# Patient Record
Sex: Male | Born: 1950
Health system: Southern US, Community
[De-identification: ages and names within clinical notes are randomized; demographics above are authoritative.]

## PROBLEM LIST (undated history)

## (undated) DIAGNOSIS — N529 Male erectile dysfunction, unspecified: Secondary | ICD-10-CM

## (undated) DIAGNOSIS — I739 Peripheral vascular disease, unspecified: Secondary | ICD-10-CM

## (undated) DIAGNOSIS — I10 Essential (primary) hypertension: Secondary | ICD-10-CM

## (undated) DIAGNOSIS — E785 Hyperlipidemia, unspecified: Secondary | ICD-10-CM

## (undated) DIAGNOSIS — E119 Type 2 diabetes mellitus without complications: Secondary | ICD-10-CM

## (undated) HISTORY — DX: Male erectile dysfunction, unspecified: N52.9

## (undated) HISTORY — DX: Peripheral vascular disease, unspecified: I73.9

## (undated) HISTORY — PX: KNEE SURGERY: SHX244

## (undated) HISTORY — DX: Essential (primary) hypertension: I10

## (undated) HISTORY — DX: Hyperlipidemia, unspecified: E78.5

## (undated) HISTORY — DX: Type 2 diabetes mellitus without complications: E11.9

---

## 2005-10-15 ENCOUNTER — Ambulatory Visit (HOSPITAL_COMMUNITY): Admission: RE | Admit: 2005-10-15 | Discharge: 2005-10-15 | Payer: Self-pay | Admitting: *Deleted

## 2011-08-11 ENCOUNTER — Ambulatory Visit (INDEPENDENT_AMBULATORY_CARE_PROVIDER_SITE_OTHER): Payer: BC Managed Care – PPO

## 2011-08-11 DIAGNOSIS — I10 Essential (primary) hypertension: Secondary | ICD-10-CM

## 2011-08-11 DIAGNOSIS — E789 Disorder of lipoprotein metabolism, unspecified: Secondary | ICD-10-CM

## 2011-08-11 DIAGNOSIS — E236 Other disorders of pituitary gland: Secondary | ICD-10-CM

## 2012-02-19 ENCOUNTER — Ambulatory Visit (INDEPENDENT_AMBULATORY_CARE_PROVIDER_SITE_OTHER): Payer: BC Managed Care – PPO | Admitting: Family Medicine

## 2012-02-19 VITALS — BP 124/83 | HR 80 | Temp 98.1°F | Resp 18 | Ht 71.0 in | Wt 186.0 lb

## 2012-02-19 DIAGNOSIS — N529 Male erectile dysfunction, unspecified: Secondary | ICD-10-CM

## 2012-02-19 DIAGNOSIS — E785 Hyperlipidemia, unspecified: Secondary | ICD-10-CM

## 2012-02-19 DIAGNOSIS — M7710 Lateral epicondylitis, unspecified elbow: Secondary | ICD-10-CM

## 2012-02-19 DIAGNOSIS — I10 Essential (primary) hypertension: Secondary | ICD-10-CM

## 2012-02-19 DIAGNOSIS — M79609 Pain in unspecified limb: Secondary | ICD-10-CM

## 2012-02-19 LAB — BASIC METABOLIC PANEL
CO2: 28 mEq/L (ref 19–32)
Calcium: 9.9 mg/dL (ref 8.4–10.5)
Chloride: 102 mEq/L (ref 96–112)
Glucose, Bld: 122 mg/dL — ABNORMAL HIGH (ref 70–99)
Potassium: 3.6 mEq/L (ref 3.5–5.3)
Sodium: 139 mEq/L (ref 135–145)

## 2012-02-19 LAB — POCT URINALYSIS DIPSTICK
Bilirubin, UA: NEGATIVE
Blood, UA: NEGATIVE
Glucose, UA: NEGATIVE
Ketones, UA: NEGATIVE
Leukocytes, UA: NEGATIVE
Nitrite, UA: NEGATIVE
Protein, UA: NEGATIVE
Spec Grav, UA: 1.02
Urobilinogen, UA: 0.2
pH, UA: 5.5

## 2012-02-19 LAB — BASIC METABOLIC PANEL WITH GFR
BUN: 18 mg/dL (ref 6–23)
Creat: 1.42 mg/dL — ABNORMAL HIGH (ref 0.50–1.35)

## 2012-02-19 MED ORDER — ROSUVASTATIN CALCIUM 10 MG PO TABS: 10.0000 mg | ORAL_TABLET | Freq: Every day | ORAL | Status: DC

## 2012-02-19 MED ORDER — ATENOLOL 25 MG PO TABS: 25.0000 mg | ORAL_TABLET | Freq: Every day | ORAL | Status: DC

## 2012-02-19 MED ORDER — SILDENAFIL CITRATE 100 MG PO TABS: 100.0000 mg | ORAL_TABLET | Freq: Every day | ORAL | Status: DC | PRN

## 2012-02-19 MED ORDER — LISINOPRIL-HYDROCHLOROTHIAZIDE 20-12.5 MG PO TABS: 1.0000 | ORAL_TABLET | Freq: Every day | ORAL | Status: DC

## 2012-02-19 MED ORDER — NIFEDIPINE ER 60 MG PO TB24: 60.0000 mg | ORAL_TABLET | Freq: Every day | ORAL | Status: DC

## 2012-02-19 NOTE — Progress Notes (Signed)
[facility]:  Office Visit  Chief Complaint:  Chief Complaint  Patient presents with  . Arm Pain    right    HPI: Jerry Rangel is a 61 y.o. male who complains of : 1. Medication Refill-HTN, XOL well controlled, compliant, No SEs 2. Right arm pain at elbow-throbbing pain intermittent, radiates to shoulder, lasts few minutes, has been for 1 year or more, has not tried anything for it. Denies numbness/tingling.   Past Medical History  Diagnosis Date  . Hyperlipidemia   . Hypertension   . Erectile dysfunction    History reviewed. No pertinent past surgical history. History   Social History  . Marital Status: Married    Spouse Name: N/A    Number of Children: N/A  . Years of Education: N/A   Social History Main Topics  . Smoking status: Never Smoker   . Smokeless tobacco: None  . Alcohol Use: Yes  . Drug Use: No  . Sexually Active: None   Other Topics Concern  . None   Social History Narrative  . None   No family history on file. No Known Allergies Prior to Admission medications   Medication Sig Start Date End Date Taking? Authorizing Provider  aspirin 81 MG tablet Take 81 mg by mouth daily.   Yes Historical Provider, MD  atenolol (TENORMIN) 25 MG tablet Take 25 mg by mouth daily.   Yes Historical Provider, MD  lisinopril-hydrochlorothiazide (PRINZIDE,ZESTORETIC) 20-12.5 MG per tablet Take 1 tablet by mouth daily.   Yes Historical Provider, MD  NIFEdipine (PROCARDIA-XL/ADALAT CC) 60 MG 24 hr tablet Take 60 mg by mouth daily.   Yes Historical Provider, MD  rosuvastatin (CRESTOR) 10 MG tablet Take 10 mg by mouth daily.   Yes Historical Provider, MD  sildenafil (VIAGRA) 100 MG tablet Take 100 mg by mouth daily as needed.   Yes Historical Provider, MD     ROS: The patient denies fevers, chills, night sweats, unintentional weight loss, chest pain, palpitations, wheezing, dyspnea on exertion, nausea, vomiting, abdominal pain, dysuria, hematuria,  melena, numbness, weakness, or tingling.   All other systems have been reviewed and were otherwise negative with the exception of those mentioned in the HPI and as above.    PHYSICAL EXAM: Filed Vitals:   02/19/12 1448  BP: 124/83  Pulse: 80  Temp: 98.1 F (36.7 C)  Resp: 18   Filed Vitals:   02/19/12 1448  Height: 5\' 11"  (1.803 m)  Weight: 186 lb (84.369 kg)   Body mass index is 25.94 kg/(m^2).  General: Alert, no acute distress HEENT:  Normocephalic, atraumatic, oropharynx patent.  Cardiovascular:  Regular rate and rhythm, no rubs murmurs or gallops.  No Carotid bruits, radial pulse intact. No pedal edema.  Respiratory: Clear to auscultation bilaterally.  No wheezes, rales, or rhonchi.  No cyanosis, no use of accessory musculature GI: No organomegaly, abdomen is soft and non-tender, positive bowel sounds.  No masses. Skin: No rashes. Neurologic: Facial musculature symmetric. Psychiatric: Patient is appropriate throughout our interaction. Lymphatic: No cervical lymphadenopathy Musculoskeletal: Gait intact. Neck-nl Right shoulder-nl Roght elbow-+ tenderness at lateral epicondyle, AROM/PROM intact, +radial pulse, 5/5 strength, 2/2 DTR, neg. Tinels, neg Biceps tendon    LABS: Results for orders placed in visit on 02/19/12  POCT URINALYSIS DIPSTICK      Component Value Range   Color, UA yellow     Clarity, UA clear     Glucose, UA neg     Bilirubin, UA neg  Ketones, UA neg     Spec Grav, UA 1.020     Blood, UA neg     pH, UA 5.5     Protein, UA neg     Urobilinogen, UA 0.2     Nitrite, UA neg     Leukocytes, UA Negative       EKG/XRAY:   Primary read interpreted by Dr. Conley Rolls at [facility].   ASSESSMENT/PLAN: Encounter Diagnoses  Name Primary?  . Pain in limb Yes  . HTN (hypertension)   . Lateral epicondylitis   . Erectile dysfunction   . Hyperlipidemia    Refill meds for HTN, XOL, ED BMP, UA for HTN Return in 6 months for fasting lipids, CMP, TSH Advise  OTC Tylenol/motrin prn for elbow pain, consider steroid injection if worsening pain     Satoya Feeley PHUONG, DO 02/19/2012 3:23 PM

## 2012-02-19 NOTE — Patient Instructions (Signed)
Lateral Epicondylitis (Tennis Elbow) with Rehab Lateral epicondylitis involves inflammation and pain around the outer portion of the elbow. The pain is caused by inflammation of the tendons in the forearm that bring back (extend) the wrist. Lateral epicondylittis is also called tennis elbow, because it is very common in tennis players. However, it may occur in any individual who extends the wrist repetitively. If lateral epicondylitis is left untreated, it may become a chronic problem. SYMPTOMS   Pain, tenderness, and inflammation on the outer (lateral) side of the elbow.   Pain or weakness with gripping activities.   Pain that increases with wrist twisting motions (playing tennis, using a screwdriver, opening a door or a jar).   Pain with lifting objects, including a coffee cup.  CAUSES  Lateral epicondylitis is caused by inflammation of the tendons that extend the wrist. Causes of injury may include:  Repetitive stress and strain on the muscles and tendons that extend the wrist.   Sudden change in activity level or intensity.   Incorrect grip in racquet sports.   Incorrect grip size of racquet (often too large).   Incorrect hitting position or technique (usually backhand, leading with the elbow).   Using a racket that is too heavy.  RISK INCREASES WITH:  Sports or occupations that require repetitive and/or strenuous forearm and wrist movements (tennis, squash, racquetball, carpentry).   Poor wrist and forearm strength and flexibility.   Failure to warm up properly before activity.   Resuming activity before healing, rehabilitation, and conditioning are complete.  PREVENTION   Warm up and stretch properly before activity.   Maintain physical fitness:   Strength, flexibility, and endurance.   Cardiovascular fitness.   Wear and use properly fitted equipment.   Learn and use proper technique and have a coach correct improper technique.   Wear a tennis elbow  (counterforce) brace.  PROGNOSIS  The course of this condition depends on the degree of the injury. If treated properly, acute cases (symptoms lasting less than 4 weeks) are often resolved in 2 to 6 weeks. Chronic (longer lasting cases) often resolve in 3 to 6 months, but may require physical therapy. RELATED COMPLICATIONS   Frequently recurring symptoms, resulting in a chronic problem. Properly treating the problem the first time decreases frequency of recurrence.   Chronic inflammation, scarring tendon degeneration, and partial tendon tear, requiring surgery.   Delayed healing or resolution of symptoms.  TREATMENT  Treatment first involves the use of ice and medicine, to reduce pain and inflammation. Strengthening and stretching exercises may help reduce discomfort, if performed regularly. These exercises may be performed at home, if the condition is an acute injury. Chronic cases may require a referral to a physical therapist for evaluation and treatment. Your caregiver may advise a corticosteroid injection, to help reduce inflammation. Rarely, surgery is needed. MEDICATION  If pain medicine is needed, nonsteroidal anti-inflammatory medicines (aspirin and ibuprofen), or other minor pain relievers (acetaminophen), are often advised.   Do not take pain medicine for 7 days before surgery.   Prescription pain relievers may be given, if your caregiver thinks they are needed. Use only as directed and only as much as you need.   Corticosteroid injections may be recommended. These injections should be reserved only for the most severe cases, because they can only be given a certain number of times.  HEAT AND COLD  Cold treatment (icing) should be applied for 10 to 15 minutes every 2 to 3 hours for inflammation and pain, and immediately   after activity that aggravates your symptoms. Use ice packs or an ice massage.   Heat treatment may be used before performing stretching and strengthening  activities prescribed by your caregiver, physical therapist, or athletic trainer. Use a heat pack or a warm water soak.  SEEK MEDICAL CARE IF: Symptoms get worse or do not improve in 2 weeks, despite treatment. EXERCISES  RANGE OF MOTION (ROM) AND STRETCHING EXERCISES - Epicondylitis, Lateral (Tennis Elbow) These exercises may help you when beginning to rehabilitate your injury. Your symptoms may go away with or without further involvement from your physician, physical therapist or athletic trainer. While completing these exercises, remember:   Restoring tissue flexibility helps normal motion to return to the joints. This allows healthier, less painful movement and activity.   An effective stretch should be held for at least 30 seconds.   A stretch should never be painful. You should only feel a gentle lengthening or release in the stretched tissue.  RANGE OF MOTION - Wrist Flexion, Active-Assisted  Extend your right / left elbow with your fingers pointing down.*   Gently pull the back of your hand towards you, until you feel a gentle stretch on the top of your forearm.   Hold this position for __________ seconds.  Repeat __________ times. Complete this exercise __________ times per day.  *If directed by your physician, physical therapist or athletic trainer, complete this stretch with your elbow bent, rather than extended. RANGE OF MOTION - Wrist Extension, Active-Assisted  Extend your right / left elbow and turn your palm upwards.*   Gently pull your palm and fingertips back, so your wrist extends and your fingers point more toward the ground.   You should feel a gentle stretch on the inside of your forearm.   Hold this position for __________ seconds.  Repeat __________ times. Complete this exercise __________ times per day. *If directed by your physician, physical therapist or athletic trainer, complete this stretch with your elbow bent, rather than extended. STRETCH - Wrist  Flexion  Place the back of your right / left hand on a tabletop, leaving your elbow slightly bent. Your fingers should point away from your body.   Gently press the back of your hand down onto the table by straightening your elbow. You should feel a stretch on the top of your forearm.   Hold this position for __________ seconds.  Repeat __________ times. Complete this stretch __________ times per day.  STRETCH - Wrist Extension   Place your right / left fingertips on a tabletop, leaving your elbow slightly bent. Your fingers should point backwards.   Gently press your fingers and palm down onto the table by straightening your elbow. You should feel a stretch on the inside of your forearm.   Hold this position for __________ seconds.  Repeat __________ times. Complete this stretch __________ times per day.  STRENGTHENING EXERCISES - Epicondylitis, Lateral (Tennis Elbow) These exercises may help you when beginning to rehabilitate your injury. They may resolve your symptoms with or without further involvement from your physician, physical therapist or athletic trainer. While completing these exercises, remember:   Muscles can gain both the endurance and the strength needed for everyday activities through controlled exercises.   Complete these exercises as instructed by your physician, physical therapist or athletic trainer. Increase the resistance and repetitions only as guided.   You may experience muscle soreness or fatigue, but the pain or discomfort you are trying to eliminate should never worsen during these exercises. If   this pain does get worse, stop and make sure you are following the directions exactly. If the pain is still present after adjustments, discontinue the exercise until you can discuss the trouble with your caregiver.  STRENGTH - Wrist Flexors  Sit with your right / left forearm palm-up and fully supported on a table or countertop. Your elbow should be resting below the  height of your shoulder. Allow your wrist to extend over the edge of the surface.   Loosely holding a __________ weight, or a piece of rubber exercise band or tubing, slowly curl your hand up toward your forearm.   Hold this position for __________ seconds. Slowly lower the wrist back to the starting position in a controlled manner.  Repeat __________ times. Complete this exercise __________ times per day.  STRENGTH - Wrist Extensors  Sit with your right / left forearm palm-down and fully supported on a table or countertop. Your elbow should be resting below the height of your shoulder. Allow your wrist to extend over the edge of the surface.   Loosely holding a __________ weight, or a piece of rubber exercise band or tubing, slowly curl your hand up toward your forearm.   Hold this position for __________ seconds. Slowly lower the wrist back to the starting position in a controlled manner.  Repeat __________ times. Complete this exercise __________ times per day.  STRENGTH - Ulnar Deviators  Stand with a ____________________ weight in your right / left hand, or sit while holding a rubber exercise band or tubing, with your healthy arm supported on a table or countertop.   Move your wrist, so that your pinkie travels toward your forearm and your thumb moves away from your forearm.   Hold this position for __________ seconds and then slowly lower the wrist back to the starting position.  Repeat __________ times. Complete this exercise __________ times per day STRENGTH - Radial Deviators  Stand with a ____________________ weight in your right / left hand, or sit while holding a rubber exercise band or tubing, with your injured arm supported on a table or countertop.   Raise your hand upward in front of you or pull up on the rubber tubing.   Hold this position for __________ seconds and then slowly lower the wrist back to the starting position.  Repeat __________ times. Complete this  exercise __________ times per day. STRENGTH - Forearm Supinators   Sit with your right / left forearm supported on a table, keeping your elbow below shoulder height. Rest your hand over the edge, palm down.   Gently grip a hammer or a soup ladle.   Without moving your elbow, slowly turn your palm and hand upward to a "thumbs-up" position.   Hold this position for __________ seconds. Slowly return to the starting position.  Repeat __________ times. Complete this exercise __________ times per day.  STRENGTH - Forearm Pronators   Sit with your right / left forearm supported on a table, keeping your elbow below shoulder height. Rest your hand over the edge, palm up.   Gently grip a hammer or a soup ladle.   Without moving your elbow, slowly turn your palm and hand upward to a "thumbs-up" position.   Hold this position for __________ seconds. Slowly return to the starting position.  Repeat __________ times. Complete this exercise __________ times per day.  STRENGTH - Grip  Grasp a tennis ball, a dense sponge, or a large, rolled sock in your hand.   Squeeze as hard   as you can, without increasing any pain.   Hold this position for __________ seconds. Release your grip slowly.  Repeat __________ times. Complete this exercise __________ times per day.  STRENGTH - Elbow Extensors, Isometric  Stand or sit upright, on a firm surface. Place your right / left arm so that your palm faces your stomach, and it is at the height of your waist.   Place your opposite hand on the underside of your forearm. Gently push up as your right / left arm resists. Push as hard as you can with both arms, without causing any pain or movement at your right / left elbow. Hold this stationary position for __________ seconds.  Gradually release the tension in both arms. Allow your muscles to relax completely before repeating. Document Released: 07/16/2005 Document Revised: 07/05/2011 Document Reviewed:  10/28/2008 [employer] Patient Information 2012 [employer]. 

## 2012-02-21 ENCOUNTER — Encounter: Payer: Self-pay | Admitting: Family Medicine

## 2012-02-27 ENCOUNTER — Other Ambulatory Visit: Payer: Self-pay | Admitting: Family Medicine

## 2012-02-27 ENCOUNTER — Telehealth: Payer: Self-pay | Admitting: *Deleted

## 2012-02-27 NOTE — Telephone Encounter (Signed)
Pt states he is taking his lisinopril/hctz 20-12.5mg  2 tablets daily and needs new rx. Can we send in new rx?

## 2012-02-28 ENCOUNTER — Other Ambulatory Visit: Payer: Self-pay | Admitting: Family Medicine

## 2012-02-28 DIAGNOSIS — I10 Essential (primary) hypertension: Secondary | ICD-10-CM

## 2012-02-28 MED ORDER — LISINOPRIL-HYDROCHLOROTHIAZIDE 20-12.5 MG PO TABS: 1.0000 | ORAL_TABLET | Freq: Two times a day (BID) | ORAL | Status: DC

## 2012-03-17 ENCOUNTER — Other Ambulatory Visit: Payer: Self-pay | Admitting: Family Medicine

## 2012-03-17 NOTE — Telephone Encounter (Signed)
Patient's chart is at the nurses station in the pa pool pile. °

## 2012-07-22 ENCOUNTER — Other Ambulatory Visit: Payer: Self-pay | Admitting: Family Medicine

## 2012-08-14 ENCOUNTER — Other Ambulatory Visit: Payer: Self-pay | Admitting: Family Medicine

## 2012-08-26 ENCOUNTER — Other Ambulatory Visit: Payer: Self-pay | Admitting: Family Medicine

## 2012-08-31 ENCOUNTER — Other Ambulatory Visit: Payer: Self-pay | Admitting: Family Medicine

## 2012-09-04 ENCOUNTER — Other Ambulatory Visit: Payer: Self-pay | Admitting: Family Medicine

## 2012-09-04 NOTE — Telephone Encounter (Signed)
Pt due for OV, labs Jan 2014

## 2012-09-08 ENCOUNTER — Ambulatory Visit (INDEPENDENT_AMBULATORY_CARE_PROVIDER_SITE_OTHER): Payer: BC Managed Care – PPO

## 2012-09-14 ENCOUNTER — Other Ambulatory Visit: Payer: Self-pay | Admitting: Physician Assistant

## 2012-10-07 ENCOUNTER — Ambulatory Visit (INDEPENDENT_AMBULATORY_CARE_PROVIDER_SITE_OTHER): Payer: BC Managed Care – PPO | Admitting: Internal Medicine

## 2012-10-07 VITALS — BP 138/82 | HR 70 | Temp 98.4°F | Resp 16 | Ht 71.0 in | Wt 190.0 lb

## 2012-10-07 DIAGNOSIS — R739 Hyperglycemia, unspecified: Secondary | ICD-10-CM

## 2012-10-07 DIAGNOSIS — R799 Abnormal finding of blood chemistry, unspecified: Secondary | ICD-10-CM

## 2012-10-07 DIAGNOSIS — R7309 Other abnormal glucose: Secondary | ICD-10-CM

## 2012-10-07 DIAGNOSIS — E785 Hyperlipidemia, unspecified: Secondary | ICD-10-CM

## 2012-10-07 DIAGNOSIS — R7989 Other specified abnormal findings of blood chemistry: Secondary | ICD-10-CM

## 2012-10-07 DIAGNOSIS — I10 Essential (primary) hypertension: Secondary | ICD-10-CM | POA: Insufficient documentation

## 2012-10-07 DIAGNOSIS — N529 Male erectile dysfunction, unspecified: Secondary | ICD-10-CM | POA: Insufficient documentation

## 2012-10-07 LAB — POCT CBC
MCH, POC: 26.8 pg — AB (ref 27–31.2)
MCV: 81.7 fL (ref 80–97)
MID (cbc): 0.5 (ref 0–0.9)
POC LYMPH PERCENT: 29.6 %L (ref 10–50)
Platelet Count, POC: 150 10*3/uL (ref 142–424)
RBC: 4.88 M/uL (ref 4.69–6.13)
WBC: 6.5 10*3/uL (ref 4.6–10.2)

## 2012-10-07 MED ORDER — NIFEDIPINE ER OSMOTIC RELEASE 60 MG PO TB24: ORAL_TABLET | ORAL | Status: DC

## 2012-10-07 MED ORDER — LISINOPRIL-HYDROCHLOROTHIAZIDE 20-12.5 MG PO TABS: ORAL_TABLET | ORAL | Status: DC

## 2012-10-07 MED ORDER — ATENOLOL 25 MG PO TABS: 25.0000 mg | ORAL_TABLET | Freq: Every day | ORAL | Status: DC

## 2012-10-07 MED ORDER — ROSUVASTATIN CALCIUM 10 MG PO TABS: ORAL_TABLET | ORAL | Status: DC

## 2012-10-07 MED ORDER — SILDENAFIL CITRATE 100 MG PO TABS: ORAL_TABLET | ORAL | Status: DC

## 2012-10-07 NOTE — Progress Notes (Signed)
Subjective:    Patient ID: Jerry Rangel, male    DOB: 1951/06/14, 62 y.o.   MRN: [id number]  HPIsee last ov Here for med refills Asymptomatic Still complaining of occasional right shoulder pain/working out frequently lifting weights/rare pain with activity  Colonoscopy recent Last labs revealed hyperglycemia and mild increase in creatinine Immunizations reported as up to date  Review of Systems No fever chills night sweats No weight loss No headaches/no vision changes No shortness of breath No chest pain or palpitations No edema No nocturia or frequency/no hesitation/no dribbling/no nocturia Viagra works    Objective:   Physical Exam BP 138/82  Pulse 70  Temp(Src) 98.4 F (36.9 C) (Oral)  Resp 16  Ht 5\' 11"  (1.803 m)  Wt 190 lb (86.183 kg)  BMI 26.51 kg/m2  SpO2 98% PERRLA/EOMs conj Neck supple without thyromegaly or bruits Lungs clear Heart regular without murmur Extremities without edema/good peripheral pulses  Results for orders placed in visit on 10/07/12  POCT CBC      Result Value Range   WBC 6.5  4.6 - 10.2 K/uL   Lymph, poc 1.9  0.6 - 3.4   POC LYMPH PERCENT 29.6  10 - 50 %L   MID (cbc) 0.5  0 - 0.9   POC MID % 7.4  0 - 12 %M   POC Granulocyte 4.1  2 - 6.9   Granulocyte percent 63.0  37 - 80 %G   RBC 4.88  4.69 - 6.13 M/uL   Hemoglobin 13.1 (*) 14.1 - 18.1 g/dL   HCT, POC 32.4 (*) 40.1 - 53.7 %   MCV 81.7  80 - 97 fL   MCH, POC 26.8 (*) 27 - 31.2 pg   MCHC 32.8  31.8 - 35.4 g/dL   RDW, POC 02.7     Platelet Count, POC 15.0 (*) 142 - 424 K/uL   MPV 13.7  0 - 99.8 fL  POCT GLYCOSYLATED HEMOGLOBIN (HGB A1C)      Result Value Range   Hemoglobin A1C 5.9          Assessment & Plan:  HTN (hypertension) - Plan: POCT CBC, Comprehensive metabolic panel  Other and unspecified hyperlipidemia - Plan: Lipid panel  ED (erectile dysfunction) - Plan: Comprehensive metabolic panel, PSA  Hyperglycemia - Plan: POCT glycosylated hemoglobin (Hb  A1C)  Creatinine elevation - Plan: Comprehensive metabolic panel  Meds ordered this encounter  Medications  . atenolol (TENORMIN) 25 MG tablet    Sig: Take 1 tablet (25 mg total) by mouth daily.    Dispense:  90 tablet    Refill:  3  . rosuvastatin (CRESTOR) 10 MG tablet    Sig: TAKE 1 TABLET AT BEDTIME    Dispense:  90 tablet    Refill:  3  . lisinopril-hydrochlorothiazide (PRINZIDE,ZESTORETIC) 20-12.5 MG per tablet    Sig: TAKE 1 TABLET BY MOUTH 2 (TWO) TIMES DAILY.    Dispense:  180 tablet    Refill:  3  . NIFEdipine (PROCARDIA XL/ADALAT-CC) 60 MG 24 hr tablet    Sig: TAKE 1 TABLET (60 MG TOTAL) BY MOUTH DAILY.    Dispense:  90 tablet    Refill:  3    Needs office visit, last notice  . sildenafil (VIAGRA) 100 MG tablet    Sig: TAKE 1 TABLET PRIOR TO INTERCOURSE AS DIRECTED    Dispense:  10 tablet    Refill:  2   It might be possible to discontinue beta blockers, change hydrochlorothiazide chlorthalidone, and  use an ace or ARB, and then amlodipine if needed. For now he is having no side effects on these medications and so no changes will be made unless labs indicate

## 2012-10-08 LAB — COMPREHENSIVE METABOLIC PANEL
Albumin: 4.9 g/dL (ref 3.5–5.2)
Alkaline Phosphatase: 49 U/L (ref 39–117)
BUN: 15 mg/dL (ref 6–23)
Calcium: 9.6 mg/dL (ref 8.4–10.5)
Glucose, Bld: 144 mg/dL — ABNORMAL HIGH (ref 70–99)
Potassium: 3.9 mEq/L (ref 3.5–5.3)

## 2012-10-08 LAB — LIPID PANEL
Cholesterol: 150 mg/dL (ref 0–200)
HDL: 38 mg/dL — ABNORMAL LOW (ref >39)
Triglycerides: 131 mg/dL (ref <150)

## 2012-10-09 ENCOUNTER — Encounter: Payer: Self-pay | Admitting: Internal Medicine

## 2012-10-10 ENCOUNTER — Encounter: Payer: Self-pay | Admitting: Internal Medicine

## 2012-10-17 ENCOUNTER — Other Ambulatory Visit: Payer: Self-pay | Admitting: Physician Assistant

## 2012-10-28 ENCOUNTER — Encounter: Payer: BC Managed Care – PPO | Admitting: Family Medicine

## 2013-02-27 ENCOUNTER — Other Ambulatory Visit: Payer: Self-pay | Admitting: Physician Assistant

## 2013-03-05 ENCOUNTER — Ambulatory Visit (INDEPENDENT_AMBULATORY_CARE_PROVIDER_SITE_OTHER): Payer: BC Managed Care – PPO | Admitting: Family Medicine

## 2013-03-05 VITALS — BP 126/64 | HR 63 | Temp 98.1°F | Resp 16 | Ht 71.5 in | Wt 184.2 lb

## 2013-03-05 DIAGNOSIS — L309 Dermatitis, unspecified: Secondary | ICD-10-CM

## 2013-03-05 DIAGNOSIS — L259 Unspecified contact dermatitis, unspecified cause: Secondary | ICD-10-CM

## 2013-03-05 MED ORDER — TRIAMCINOLONE ACETONIDE 0.1 % EX CREA: TOPICAL_CREAM | Freq: Three times a day (TID) | CUTANEOUS | Status: DC

## 2013-03-05 NOTE — Progress Notes (Signed)
 [facility]:  Office Visit  Chief Complaint:  Chief Complaint  Patient presents with  . has a sore spot on Rt Leg x 1 month    HPI: Jerry Rangel is a 62 y.o. male who complains of  1 month history of left lower leg sore spot that started out as blister and then became dark and crusty.  He denies DM, + minimal to no itching. No cuts. + tender when he touches it but not right now. No history of eczema. Denies fevers, chills, drainage.   Past Medical History  Diagnosis Date  . Hyperlipidemia   . Hypertension   . Erectile dysfunction    History reviewed. No pertinent past surgical history. History   Social History  . Marital Status: Married    Spouse Name: N/A    Number of Children: N/A  . Years of Education: N/A   Social History Main Topics  . Smoking status: Never Smoker   . Smokeless tobacco: Never Used  . Alcohol Use: 1.2 oz/week    2 Glasses of wine per week  . Drug Use: No  . Sexually Active: Yes   Other Topics Concern  . None   Social History Narrative  . None   Family History  Problem Relation Age of Onset  . Diabetes Mother   . Hypertension Mother    No Known Allergies Prior to Admission medications   Medication Sig Start Date End Date Taking? Authorizing Provider  aspirin 81 MG tablet Take 81 mg by mouth daily.   Yes Historical Provider, MD  atenolol (TENORMIN) 25 MG tablet Take 1 tablet (25 mg total) by mouth daily. 10/07/12  Yes Tonye Pearson, MD  lisinopril-hydrochlorothiazide (PRINZIDE,ZESTORETIC) 20-12.5 MG per tablet TAKE 1 TABLET BY MOUTH 2 (TWO) TIMES DAILY. 10/07/12  Yes Tonye Pearson, MD  NIFEdipine (PROCARDIA XL/ADALAT-CC) 60 MG 24 hr tablet TAKE 1 TABLET (60 MG TOTAL) BY MOUTH DAILY. 10/07/12  Yes Tonye Pearson, MD  rosuvastatin (CRESTOR) 10 MG tablet TAKE 1 TABLET AT BEDTIME 10/07/12  Yes Tonye Pearson, MD  sildenafil (VIAGRA) 100 MG tablet TAKE 1 TABLET PRIOR TO INTERCOURSE AS DIRECTED 10/07/12  Yes Tonye Pearson, MD     ROS: The patient denies fevers, chills, night sweats, unintentional weight loss, chest pain, palpitations, wheezing, dyspnea on exertion, nausea, vomiting, abdominal pain, dysuria, hematuria, melena, numbness, weakness, or tingling.   All other systems have been reviewed and were otherwise negative with the exception of those mentioned in the HPI and as above.    PHYSICAL EXAM: Filed Vitals:   03/05/13 1053  BP: 126/64  Pulse: 63  Temp: 98.1 F (36.7 C)  Resp: 16   Filed Vitals:   03/05/13 1053  Height: 5' 11.5" (1.816 m)  Weight: 184 lb 3.2 oz (83.553 kg)   Body mass index is 25.34 kg/(m^2).  General: Alert, no acute distress HEENT:  Normocephalic, atraumatic, oropharynx patent.  Cardiovascular:  Regular rate and rhythm, no rubs murmurs or gallops.  No Carotid bruits, radial pulse intact. No pedal edema.  Respiratory: Clear to auscultation bilaterally.  No wheezes, rales, or rhonchi.  No cyanosis, no use of accessory musculature GI: No organomegaly, abdomen is soft and non-tender, positive bowel sounds.  No masses. Skin: + hyperpigmented hyperkeratotic area near left posterior ankle, + DP, no blisters, no erythema, no drainage Neurologic: Facial musculature symmetric. Psychiatric: Patient is appropriate throughout our interaction. Lymphatic: No cervical lymphadenopathy Musculoskeletal: Gait intact.   LABS: Results  for orders placed in visit on 10/07/12  COMPREHENSIVE METABOLIC PANEL      Result Value Range   Sodium 139  135 - 145 mEq/L   Potassium 3.9  3.5 - 5.3 mEq/L   Chloride 103  96 - 112 mEq/L   CO2 28  19 - 32 mEq/L   Glucose, Bld 144 (*) 70 - 99 mg/dL   BUN 15  6 - 23 mg/dL   Creat 1.61  0.96 - 0.45 mg/dL   Total Bilirubin 0.5  0.3 - 1.2 mg/dL   Alkaline Phosphatase 49  39 - 117 U/L   AST 40 (*) 0 - 37 U/L   ALT 45  0 - 53 U/L   Total Protein 7.4  6.0 - 8.3 g/dL   Albumin 4.9  3.5 - 5.2 g/dL   Calcium 9.6  8.4 - 40.9 mg/dL  LIPID PANEL       Result Value Range   Cholesterol 150  0 - 200 mg/dL   Triglycerides 811  <914 mg/dL   HDL 38 (*) >78 mg/dL   Total CHOL/HDL Ratio 3.9     VLDL 26  0 - 40 mg/dL   LDL Cholesterol 86  0 - 99 mg/dL  PSA      Result Value Range   PSA 0.79  <=4.00 ng/mL  POCT CBC      Result Value Range   WBC 6.5  4.6 - 10.2 K/uL   Lymph, poc 1.9  0.6 - 3.4   POC LYMPH PERCENT 29.6  10 - 50 %L   MID (cbc) 0.5  0 - 0.9   POC MID % 7.4  0 - 12 %M   POC Granulocyte 4.1  2 - 6.9   Granulocyte percent 63.0  37 - 80 %G   RBC 4.88  4.69 - 6.13 M/uL   Hemoglobin 13.1 (*) 14.1 - 18.1 g/dL   HCT, POC 29.5 (*) 62.1 - 53.7 %   MCV 81.7  80 - 97 fL   MCH, POC 26.8 (*) 27 - 31.2 pg   MCHC 32.8  31.8 - 35.4 g/dL   RDW, POC 30.8     Platelet Count, POC 150  142 - 424 K/uL   MPV 13.7  0 - 99.8 fL  POCT GLYCOSYLATED HEMOGLOBIN (HGB A1C)      Result Value Range   Hemoglobin A1C 5.9       EKG/XRAY:   Primary read interpreted by Dr. Conley Rolls at [facility].   ASSESSMENT/PLAN: Encounter Diagnosis  Name Primary?  . Dermatitis Yes   Residual inflammatory response from blister Hyperpigmentedm hyperkeratotic area Trial of Triamcinolone cream Gross sideeffects, risk and benefits, and alternatives of medications d/w patient. Patient is aware that all medications have potential sideeffects and we are unable to predict every sideeffect or drug-drug interaction that may occur. F/u prn    ,  PHUONG, DO 03/05/2013 11:52 AM

## 2013-04-08 ENCOUNTER — Ambulatory Visit: Payer: BC Managed Care – PPO | Admitting: Physician Assistant

## 2013-05-03 ENCOUNTER — Other Ambulatory Visit: Payer: Self-pay | Admitting: Internal Medicine

## 2013-08-10 ENCOUNTER — Other Ambulatory Visit: Payer: Self-pay | Admitting: Internal Medicine

## 2013-08-10 NOTE — Telephone Encounter (Signed)
Please advise refill? 

## 2013-08-20 ENCOUNTER — Other Ambulatory Visit: Payer: Self-pay | Admitting: Internal Medicine

## 2013-09-14 ENCOUNTER — Other Ambulatory Visit: Payer: Self-pay | Admitting: Internal Medicine

## 2013-09-14 DIAGNOSIS — E049 Nontoxic goiter, unspecified: Secondary | ICD-10-CM

## 2013-09-22 ENCOUNTER — Other Ambulatory Visit: Payer: Self-pay

## 2013-09-29 ENCOUNTER — Other Ambulatory Visit: Payer: Self-pay | Admitting: Internal Medicine

## 2013-09-29 ENCOUNTER — Ambulatory Visit
Admission: RE | Admit: 2013-09-29 | Discharge: 2013-09-29 | Disposition: A | Discharge status: Home / Self Care | Payer: BC Managed Care – PPO | Source: Ambulatory Visit | Attending: Internal Medicine | Admitting: Internal Medicine

## 2013-09-29 DIAGNOSIS — E049 Nontoxic goiter, unspecified: Secondary | ICD-10-CM

## 2013-10-04 ENCOUNTER — Other Ambulatory Visit: Payer: Self-pay | Admitting: Internal Medicine

## 2013-10-12 ENCOUNTER — Other Ambulatory Visit: Payer: Self-pay | Admitting: Internal Medicine

## 2013-10-12 DIAGNOSIS — E041 Nontoxic single thyroid nodule: Secondary | ICD-10-CM

## 2013-10-16 ENCOUNTER — Other Ambulatory Visit: Payer: Self-pay | Admitting: Internal Medicine

## 2013-10-18 NOTE — Telephone Encounter (Signed)
Needs OV and labs 

## 2013-10-27 ENCOUNTER — Other Ambulatory Visit (HOSPITAL_COMMUNITY)
Admission: RE | Admit: 2013-10-27 | Discharge: 2013-10-27 | Disposition: A | Discharge status: Home / Self Care | Payer: BC Managed Care – PPO | Source: Ambulatory Visit | Attending: Diagnostic Radiology | Admitting: Diagnostic Radiology

## 2013-10-27 ENCOUNTER — Ambulatory Visit
Admission: RE | Admit: 2013-10-27 | Discharge: 2013-10-27 | Disposition: A | Discharge status: Home / Self Care | Payer: BC Managed Care – PPO | Source: Ambulatory Visit | Attending: Internal Medicine | Admitting: Internal Medicine

## 2013-10-27 DIAGNOSIS — E041 Nontoxic single thyroid nodule: Secondary | ICD-10-CM | POA: Insufficient documentation

## 2013-11-22 ENCOUNTER — Other Ambulatory Visit: Payer: Self-pay | Admitting: Internal Medicine

## 2014-08-16 ENCOUNTER — Other Ambulatory Visit: Payer: Self-pay | Admitting: Physician Assistant

## 2014-09-15 ENCOUNTER — Ambulatory Visit (INDEPENDENT_AMBULATORY_CARE_PROVIDER_SITE_OTHER): Payer: BC Managed Care – PPO | Admitting: Emergency Medicine

## 2014-09-15 VITALS — BP 124/68 | HR 80 | Temp 97.8°F | Resp 16 | Ht 71.0 in | Wt 181.0 lb

## 2014-09-15 DIAGNOSIS — M5432 Sciatica, left side: Secondary | ICD-10-CM

## 2014-09-15 MED ORDER — NAPROXEN SODIUM 550 MG PO TABS: 550.0000 mg | ORAL_TABLET | Freq: Two times a day (BID) | ORAL | Status: AC

## 2014-09-15 MED ORDER — CYCLOBENZAPRINE HCL 10 MG PO TABS: 10.0000 mg | ORAL_TABLET | Freq: Three times a day (TID) | ORAL | Status: DC | PRN

## 2014-09-15 NOTE — Progress Notes (Signed)
[facility] [address], [place] [address] [phone]  Date:  09/15/2014   Name:  Jerry Rangel   DOB:  08/31/1950   MRN:  [id number]  PCP:  Anne Ng    Chief Complaint: Leg Pain   History of Present Illness:  Jerry Rangel is a 64 y.o. 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 No history of injury or overuse that he recollects. No neuro symptoms. No back pain. Says worse when gets up and weights his left leg and then improves Non smoker.  No claudication. No improvement with over the counter medications or other home remedies. Denies other complaint or health concern today.   Patient Active Problem List   Diagnosis Date Noted  . HTN (hypertension) 10/07/2012  . Other and unspecified hyperlipidemia 10/07/2012  . ED (erectile dysfunction) 10/07/2012    Past Medical History  Diagnosis Date  . Hyperlipidemia   . Hypertension   . Erectile dysfunction     History reviewed. No pertinent past surgical history.  History  Substance Use Topics  . Smoking status: Never Smoker   . Smokeless tobacco: Never Used  . Alcohol Use: 1.2 oz/week    2 Glasses of wine per week    Family History  Problem Relation Age of Onset  . Diabetes Mother   . Hypertension Mother     No Known Allergies  Medication list has been reviewed and updated.  Current Outpatient Prescriptions on File Prior to Visit  Medication Sig Dispense Refill  . aspirin 81 MG tablet Take 81 mg by mouth daily.    Marland Kitchen atenolol (TENORMIN) 25 MG tablet Take 1 tablet (25 mg total) by mouth daily. PATIENT NEEDS OFFICE VISIT FOR ADDITIONAL REFILLS 30 tablet 0  . lisinopril-hydrochlorothiazide (PRINZIDE,ZESTORETIC) 20-12.5 MG per tablet TAKE 1 TABLET BY MOUTH 2 (TWO) TIMES DAILY. 180 tablet 2  . NIFEdipine (PROCARDIA  XL/ADALAT-CC) 60 MG 24 hr tablet Take 1 tablet (60 mg total) by mouth daily. PATIENT NEEDS OFFICE VISIT FOR ADDITIONAL REFILLS 30 tablet 0  . rosuvastatin (CRESTOR) 10 MG tablet Take 1 tablet (10 mg total) by mouth daily. NEED VISIT, LABS!! 30 tablet 1  . VIAGRA 100 MG tablet TAKE 1 TABLET PRIOR TO INTERCOURSE AS DIRECTED 10 tablet 2  . triamcinolone cream (KENALOG) 0.1 % Apply topically 3 (three) times daily. X 2 weeks (Patient not taking: Reported on 09/15/2014) 30 g 1   No current facility-administered medications on file prior to visit.    Review of Systems:  As per HPI, otherwise negative.    Physical Examination: Filed Vitals:   09/15/14 0843  BP: 124/68  Pulse: 80  Temp: 97.8 F (36.6 C)  Resp: 16   Filed Vitals:   09/15/14 0843  Height:  (1.803 m)  Weight: 181 lb (82.101 kg)   Body mass index is 25.26 kg/(m^2). Ideal Body Weight: Weight in (lb) to have BMI = 25: 178.9   GEN: WDWN, NAD, Non-toxic, Alert & Oriented x 3 HEENT: Atraumatic, Normocephalic.  Ears and Nose: No external deformity. EXTR: No clubbing/cyanosis/edema NEURO: Normal gait.  PSYCH: Normally interactive. Conversant. Not depressed or anxious appearing.  Calm demeanor.  Tender left sciatic notch.  Reproduces pain.  SLR and neuro intact.  Vascular exam unremarkable.  Assessment and Plan: Sciatic neuritis Anaprox Flexeril Local  heat  Signed,  Phillips OdorJeffery Asaiah Scarber, MD

## 2014-09-15 NOTE — Patient Instructions (Signed)
Sciatica Sciatica is pain, weakness, numbness, or tingling along the path of the sciatic nerve. The nerve starts in the lower back and runs down the back of each leg. The nerve controls the muscles in the lower leg and in the back of the knee, while also providing sensation to the back of the thigh, lower leg, and the sole of your foot. Sciatica is a symptom of another medical condition. For instance, nerve damage or certain conditions, such as a herniated disk or bone spur on the spine, pinch or put pressure on the sciatic nerve. This causes the pain, weakness, or other sensations normally associated with sciatica. Generally, sciatica only affects one side of the body. CAUSES   Herniated or slipped disc.  Degenerative disk disease.  A pain disorder involving the narrow muscle in the buttocks (piriformis syndrome).  Pelvic injury or fracture.  Pregnancy.  Tumor (rare). SYMPTOMS  Symptoms can vary from mild to very severe. The symptoms usually travel from the low back to the buttocks and down the back of the leg. Symptoms can include:  Mild tingling or dull aches in the lower back, leg, or hip.  Numbness in the back of the calf or sole of the foot.  Burning sensations in the lower back, leg, or hip.  Sharp pains in the lower back, leg, or hip.  Leg weakness.  Severe back pain inhibiting movement. These symptoms may get worse with coughing, sneezing, laughing, or prolonged sitting or standing. Also, being overweight may worsen symptoms. DIAGNOSIS  Your caregiver will perform a physical exam to look for common symptoms of sciatica. He or she may ask you to do certain movements or activities that would trigger sciatic nerve pain. Other tests may be performed to find the cause of the sciatica. These may include:  Blood tests.  X-rays.  Imaging tests, such as an MRI or CT scan. TREATMENT  Treatment is directed at the cause of the sciatic pain. Sometimes, treatment is not necessary  and the pain and discomfort goes away on its own. If treatment is needed, your caregiver may suggest:  Over-the-counter medicines to relieve pain.  Prescription medicines, such as anti-inflammatory medicine, muscle relaxants, or narcotics.  Applying heat or ice to the painful area.  Steroid injections to lessen pain, irritation, and inflammation around the nerve.  Reducing activity during periods of pain.  Exercising and stretching to strengthen your abdomen and improve flexibility of your spine. Your caregiver may suggest losing weight if the extra weight makes the back pain worse.  Physical therapy.  Surgery to eliminate what is pressing or pinching the nerve, such as a bone spur or part of a herniated disk. HOME CARE INSTRUCTIONS   Only take over-the-counter or prescription medicines for pain or discomfort as directed by your caregiver.  Apply ice to the affected area for 20 minutes, 3-4 times a day for the first 48-72 hours. Then try heat in the same way.  Exercise, stretch, or perform your usual activities if these do not aggravate your pain.  Attend physical therapy sessions as directed by your caregiver.  Keep all follow-up appointments as directed by your caregiver.  Do not wear high heels or shoes that do not provide proper support.  Check your mattress to see if it is too soft. A firm mattress may lessen your pain and discomfort. SEEK IMMEDIATE MEDICAL CARE IF:   You lose control of your bowel or bladder (incontinence).  You have increasing weakness in the lower back, pelvis, buttocks,   or legs.  You have redness or swelling of your back.  You have a burning sensation when you urinate.  You have pain that gets worse when you lie down or awakens you at night.  Your pain is worse than you have experienced in the past.  Your pain is lasting longer than 4 weeks.  You are suddenly losing weight without reason. MAKE SURE YOU:  Understand these  instructions.  Will watch your condition.  Will get help right away if you are not doing well or get worse. Document Released: 07/10/2001 Document Revised: 01/15/2012 Document Reviewed: 11/25/2011 ExitCare Patient Information 2015 [employer]. This information is not intended to replace advice given to you by your health care provider. Make sure you discuss any questions you have with your health care provider.  

## 2015-02-04 ENCOUNTER — Other Ambulatory Visit: Payer: Self-pay | Admitting: Endocrinology

## 2015-02-04 DIAGNOSIS — E041 Nontoxic single thyroid nodule: Secondary | ICD-10-CM

## 2015-08-08 ENCOUNTER — Other Ambulatory Visit: Payer: BC Managed Care – PPO

## 2015-08-12 ENCOUNTER — Other Ambulatory Visit: Payer: Self-pay | Admitting: Endocrinology

## 2015-08-12 DIAGNOSIS — E041 Nontoxic single thyroid nodule: Secondary | ICD-10-CM

## 2015-08-22 ENCOUNTER — Ambulatory Visit
Admission: RE | Admit: 2015-08-22 | Discharge: 2015-08-22 | Disposition: A | Discharge status: Home / Self Care | Payer: BC Managed Care – PPO | Source: Ambulatory Visit | Attending: Endocrinology | Admitting: Endocrinology

## 2015-08-22 DIAGNOSIS — E041 Nontoxic single thyroid nodule: Secondary | ICD-10-CM

## 2016-04-19 DIAGNOSIS — E1122 Type 2 diabetes mellitus with diabetic chronic kidney disease: Secondary | ICD-10-CM | POA: Diagnosis not present

## 2016-04-19 DIAGNOSIS — N529 Male erectile dysfunction, unspecified: Secondary | ICD-10-CM | POA: Diagnosis not present

## 2016-04-19 DIAGNOSIS — Z0000 Encounter for general adult medical examination without abnormal findings: Secondary | ICD-10-CM | POA: Diagnosis not present

## 2016-04-23 DIAGNOSIS — L309 Dermatitis, unspecified: Secondary | ICD-10-CM | POA: Diagnosis not present

## 2016-05-16 DIAGNOSIS — Z1212 Encounter for screening for malignant neoplasm of rectum: Secondary | ICD-10-CM | POA: Diagnosis not present

## 2016-05-16 DIAGNOSIS — Z1211 Encounter for screening for malignant neoplasm of colon: Secondary | ICD-10-CM | POA: Diagnosis not present

## 2016-08-06 DIAGNOSIS — E049 Nontoxic goiter, unspecified: Secondary | ICD-10-CM | POA: Diagnosis not present

## 2016-08-21 ENCOUNTER — Other Ambulatory Visit: Payer: Self-pay | Admitting: Endocrinology

## 2016-08-21 DIAGNOSIS — E041 Nontoxic single thyroid nodule: Secondary | ICD-10-CM

## 2016-08-27 ENCOUNTER — Ambulatory Visit
Admission: RE | Admit: 2016-08-27 | Discharge: 2016-08-27 | Disposition: A | Discharge status: Home / Self Care | Payer: BC Managed Care – PPO | Source: Ambulatory Visit | Attending: Endocrinology | Admitting: Endocrinology

## 2016-08-27 DIAGNOSIS — E041 Nontoxic single thyroid nodule: Secondary | ICD-10-CM

## 2016-08-27 DIAGNOSIS — E042 Nontoxic multinodular goiter: Secondary | ICD-10-CM | POA: Diagnosis not present

## 2016-10-12 DIAGNOSIS — I10 Essential (primary) hypertension: Secondary | ICD-10-CM | POA: Diagnosis not present

## 2016-10-12 DIAGNOSIS — E1122 Type 2 diabetes mellitus with diabetic chronic kidney disease: Secondary | ICD-10-CM | POA: Diagnosis not present

## 2016-10-12 DIAGNOSIS — Z0000 Encounter for general adult medical examination without abnormal findings: Secondary | ICD-10-CM | POA: Diagnosis not present

## 2016-10-12 DIAGNOSIS — Z125 Encounter for screening for malignant neoplasm of prostate: Secondary | ICD-10-CM | POA: Diagnosis not present

## 2016-10-30 DIAGNOSIS — R7303 Prediabetes: Secondary | ICD-10-CM | POA: Diagnosis not present

## 2016-10-30 DIAGNOSIS — R972 Elevated prostate specific antigen [PSA]: Secondary | ICD-10-CM | POA: Diagnosis not present

## 2016-10-30 DIAGNOSIS — I10 Essential (primary) hypertension: Secondary | ICD-10-CM | POA: Diagnosis not present

## 2016-10-30 DIAGNOSIS — E7800 Pure hypercholesterolemia, unspecified: Secondary | ICD-10-CM | POA: Diagnosis not present

## 2017-05-02 DIAGNOSIS — R7303 Prediabetes: Secondary | ICD-10-CM | POA: Diagnosis not present

## 2017-05-02 DIAGNOSIS — I10 Essential (primary) hypertension: Secondary | ICD-10-CM | POA: Diagnosis not present

## 2017-05-02 DIAGNOSIS — E7800 Pure hypercholesterolemia, unspecified: Secondary | ICD-10-CM | POA: Diagnosis not present

## 2017-05-02 DIAGNOSIS — N529 Male erectile dysfunction, unspecified: Secondary | ICD-10-CM | POA: Diagnosis not present

## 2017-05-02 DIAGNOSIS — E039 Hypothyroidism, unspecified: Secondary | ICD-10-CM | POA: Diagnosis not present

## 2017-05-06 DIAGNOSIS — E7800 Pure hypercholesterolemia, unspecified: Secondary | ICD-10-CM | POA: Diagnosis not present

## 2017-05-06 DIAGNOSIS — I10 Essential (primary) hypertension: Secondary | ICD-10-CM | POA: Diagnosis not present

## 2017-05-06 DIAGNOSIS — E1122 Type 2 diabetes mellitus with diabetic chronic kidney disease: Secondary | ICD-10-CM | POA: Diagnosis not present

## 2017-05-06 DIAGNOSIS — R7303 Prediabetes: Secondary | ICD-10-CM | POA: Diagnosis not present

## 2017-05-06 DIAGNOSIS — E039 Hypothyroidism, unspecified: Secondary | ICD-10-CM | POA: Diagnosis not present

## 2017-05-06 DIAGNOSIS — N529 Male erectile dysfunction, unspecified: Secondary | ICD-10-CM | POA: Diagnosis not present

## 2017-06-25 DIAGNOSIS — E039 Hypothyroidism, unspecified: Secondary | ICD-10-CM | POA: Diagnosis not present

## 2017-07-11 DIAGNOSIS — E039 Hypothyroidism, unspecified: Secondary | ICD-10-CM | POA: Diagnosis not present

## 2017-10-31 DIAGNOSIS — I10 Essential (primary) hypertension: Secondary | ICD-10-CM | POA: Diagnosis not present

## 2017-10-31 DIAGNOSIS — E7800 Pure hypercholesterolemia, unspecified: Secondary | ICD-10-CM | POA: Diagnosis not present

## 2017-10-31 DIAGNOSIS — Z125 Encounter for screening for malignant neoplasm of prostate: Secondary | ICD-10-CM | POA: Diagnosis not present

## 2017-10-31 DIAGNOSIS — E1122 Type 2 diabetes mellitus with diabetic chronic kidney disease: Secondary | ICD-10-CM | POA: Diagnosis not present

## 2019-09-01 DIAGNOSIS — H52203 Unspecified astigmatism, bilateral: Secondary | ICD-10-CM | POA: Diagnosis not present

## 2019-09-01 DIAGNOSIS — H524 Presbyopia: Secondary | ICD-10-CM | POA: Diagnosis not present

## 2019-09-01 DIAGNOSIS — H2513 Age-related nuclear cataract, bilateral: Secondary | ICD-10-CM | POA: Diagnosis not present

## 2019-09-01 DIAGNOSIS — H5213 Myopia, bilateral: Secondary | ICD-10-CM | POA: Diagnosis not present

## 2019-09-28 DIAGNOSIS — Z1212 Encounter for screening for malignant neoplasm of rectum: Secondary | ICD-10-CM | POA: Diagnosis not present

## 2019-09-28 DIAGNOSIS — Z1211 Encounter for screening for malignant neoplasm of colon: Secondary | ICD-10-CM | POA: Diagnosis not present

## 2019-10-02 LAB — COLOGUARD: COLOGUARD: NEGATIVE

## 2019-11-11 DIAGNOSIS — E1122 Type 2 diabetes mellitus with diabetic chronic kidney disease: Secondary | ICD-10-CM | POA: Diagnosis not present

## 2019-11-11 DIAGNOSIS — Z125 Encounter for screening for malignant neoplasm of prostate: Secondary | ICD-10-CM | POA: Diagnosis not present

## 2019-11-11 DIAGNOSIS — I10 Essential (primary) hypertension: Secondary | ICD-10-CM | POA: Diagnosis not present

## 2019-11-11 DIAGNOSIS — E039 Hypothyroidism, unspecified: Secondary | ICD-10-CM | POA: Diagnosis not present

## 2019-11-11 DIAGNOSIS — E7800 Pure hypercholesterolemia, unspecified: Secondary | ICD-10-CM | POA: Diagnosis not present

## 2019-11-16 DIAGNOSIS — I10 Essential (primary) hypertension: Secondary | ICD-10-CM | POA: Diagnosis not present

## 2019-11-16 DIAGNOSIS — E1122 Type 2 diabetes mellitus with diabetic chronic kidney disease: Secondary | ICD-10-CM | POA: Diagnosis not present

## 2019-11-16 DIAGNOSIS — E039 Hypothyroidism, unspecified: Secondary | ICD-10-CM | POA: Diagnosis not present

## 2019-11-16 DIAGNOSIS — E7800 Pure hypercholesterolemia, unspecified: Secondary | ICD-10-CM | POA: Diagnosis not present

## 2019-11-16 DIAGNOSIS — Z0000 Encounter for general adult medical examination without abnormal findings: Secondary | ICD-10-CM | POA: Diagnosis not present

## 2020-02-17 DIAGNOSIS — E1122 Type 2 diabetes mellitus with diabetic chronic kidney disease: Secondary | ICD-10-CM | POA: Diagnosis not present

## 2020-03-08 DIAGNOSIS — E785 Hyperlipidemia, unspecified: Secondary | ICD-10-CM | POA: Diagnosis not present

## 2020-03-08 DIAGNOSIS — Z8249 Family history of ischemic heart disease and other diseases of the circulatory system: Secondary | ICD-10-CM | POA: Diagnosis not present

## 2020-03-08 DIAGNOSIS — N529 Male erectile dysfunction, unspecified: Secondary | ICD-10-CM | POA: Diagnosis not present

## 2020-03-08 DIAGNOSIS — R32 Unspecified urinary incontinence: Secondary | ICD-10-CM | POA: Diagnosis not present

## 2020-03-08 DIAGNOSIS — Z823 Family history of stroke: Secondary | ICD-10-CM | POA: Diagnosis not present

## 2020-03-08 DIAGNOSIS — I10 Essential (primary) hypertension: Secondary | ICD-10-CM | POA: Diagnosis not present

## 2020-03-08 DIAGNOSIS — E039 Hypothyroidism, unspecified: Secondary | ICD-10-CM | POA: Diagnosis not present

## 2020-03-08 DIAGNOSIS — Z7982 Long term (current) use of aspirin: Secondary | ICD-10-CM | POA: Diagnosis not present

## 2020-03-10 DIAGNOSIS — E039 Hypothyroidism, unspecified: Secondary | ICD-10-CM | POA: Diagnosis not present

## 2020-05-18 DIAGNOSIS — E1122 Type 2 diabetes mellitus with diabetic chronic kidney disease: Secondary | ICD-10-CM | POA: Diagnosis not present

## 2020-05-18 DIAGNOSIS — E7800 Pure hypercholesterolemia, unspecified: Secondary | ICD-10-CM | POA: Diagnosis not present

## 2020-05-18 DIAGNOSIS — E039 Hypothyroidism, unspecified: Secondary | ICD-10-CM | POA: Diagnosis not present

## 2020-05-25 DIAGNOSIS — I10 Essential (primary) hypertension: Secondary | ICD-10-CM | POA: Diagnosis not present

## 2020-05-25 DIAGNOSIS — E039 Hypothyroidism, unspecified: Secondary | ICD-10-CM | POA: Diagnosis not present

## 2020-05-25 DIAGNOSIS — L258 Unspecified contact dermatitis due to other agents: Secondary | ICD-10-CM | POA: Diagnosis not present

## 2020-05-25 DIAGNOSIS — E1122 Type 2 diabetes mellitus with diabetic chronic kidney disease: Secondary | ICD-10-CM | POA: Diagnosis not present

## 2020-05-25 DIAGNOSIS — E7800 Pure hypercholesterolemia, unspecified: Secondary | ICD-10-CM | POA: Diagnosis not present

## 2020-08-25 DIAGNOSIS — E039 Hypothyroidism, unspecified: Secondary | ICD-10-CM | POA: Diagnosis not present

## 2020-08-25 DIAGNOSIS — E1122 Type 2 diabetes mellitus with diabetic chronic kidney disease: Secondary | ICD-10-CM | POA: Diagnosis not present

## 2020-09-06 DIAGNOSIS — E1122 Type 2 diabetes mellitus with diabetic chronic kidney disease: Secondary | ICD-10-CM | POA: Diagnosis not present

## 2020-09-12 ENCOUNTER — Encounter (HOSPITAL_COMMUNITY): Payer: Self-pay | Admitting: *Deleted

## 2020-09-12 ENCOUNTER — Other Ambulatory Visit: Payer: Self-pay

## 2020-09-12 ENCOUNTER — Emergency Department (HOSPITAL_COMMUNITY): Payer: Medicare PPO

## 2020-09-12 ENCOUNTER — Emergency Department (HOSPITAL_COMMUNITY)
Admission: EM | Admit: 2020-09-12 | Discharge: 2020-09-12 | Disposition: A | Discharge status: Home / Self Care | Payer: Medicare PPO | Attending: Emergency Medicine | Admitting: Emergency Medicine

## 2020-09-12 DIAGNOSIS — S76111A Strain of right quadriceps muscle, fascia and tendon, initial encounter: Secondary | ICD-10-CM | POA: Diagnosis not present

## 2020-09-12 DIAGNOSIS — I10 Essential (primary) hypertension: Secondary | ICD-10-CM | POA: Insufficient documentation

## 2020-09-12 DIAGNOSIS — S76101A Unspecified injury of right quadriceps muscle, fascia and tendon, initial encounter: Secondary | ICD-10-CM | POA: Diagnosis not present

## 2020-09-12 DIAGNOSIS — S8991XA Unspecified injury of right lower leg, initial encounter: Secondary | ICD-10-CM | POA: Diagnosis present

## 2020-09-12 DIAGNOSIS — W108XXA Fall (on) (from) other stairs and steps, initial encounter: Secondary | ICD-10-CM | POA: Diagnosis not present

## 2020-09-12 DIAGNOSIS — Z79899 Other long term (current) drug therapy: Secondary | ICD-10-CM | POA: Diagnosis not present

## 2020-09-12 DIAGNOSIS — Z7982 Long term (current) use of aspirin: Secondary | ICD-10-CM | POA: Diagnosis not present

## 2020-09-12 DIAGNOSIS — M25561 Pain in right knee: Secondary | ICD-10-CM | POA: Diagnosis not present

## 2020-09-12 LAB — CBG MONITORING, ED: Glucose-Capillary: 302 mg/dL — ABNORMAL HIGH (ref 70–99)

## 2020-09-12 NOTE — Discharge Instructions (Addendum)
Ibuprofen as needed for pain, Tylenol as needed for pain, seek medical exam for worsening symptoms but be aware that she will have some pain and swelling of the knee.  Use the knee immobilizer when you are walking, try to use the crutches to keep your weight off of the leg.  I discussed her care with the orthopedist above, he is happy to see you at any time, call the office for follow-up

## 2020-09-12 NOTE — ED Provider Notes (Signed)
I saw and evaluated the patient, reviewed the resident's note and I agree with the findings and plan.  Pertinent History: Pt is having pain in the knee - after a fall - landed on the patella on the R - he has a significant soft deficit just proximal to the patella, there appears to be a quadriceps rupture on my exam, he cannot straight leg raise.  He has normal pulses at the feet normal sensation.  X-ray shows no obvious fractures other than a possible avulsion fracture.  Will discuss with on-call orthopedics, knee immobilizer, crutches, appears stable for discharge and follow-up.  Patient agreeable.  Pt cannot SLR due to ruptured Quad tendon  I was personally present and directly supervised the following procedures:  Evaluation of traumatic injury to the knee  I discussed the case with Dr. Susa Simmonds of the orthopedic service who is happy to see the patient in follow-up and agreeable with immobilization  I personally interpreted the EKG as well as the resident and agree with the interpretation on the resident's chart.  Final diagnoses:  Rupture of right quadriceps tendon, initial encounter       Eber Hong, MD 09/13/20 1454

## 2020-09-12 NOTE — ED Triage Notes (Signed)
Pt reports falling on the steps today and now having right knee pain and unable to bear weight.

## 2020-09-12 NOTE — ED Provider Notes (Signed)
[facility] EMERGENCY DEPARTMENT Provider Note   CSN: [id number] Arrival date & time: 09/12/20  1426     History Chief Complaint  Patient presents with  . Knee Pain  . Fall    Jerry Rangel is a 70 y.o. male with h/o HLD and HTN who presents to the ED for R knee pain. Patient walking down stairs at approximately 2PM today when he missed the last step and fell directly onto R knee. Pain immediately after fall and unable to walk since then. Denies prodromal symptoms. Denies hitting head or LOC. No other injuries reported. Patient states he has to walk backwards and drag his R leg in order to ambulate. No previous injuries to R knee.  The history is provided by the patient and medical records.  Knee Pain Location:  Knee Time since incident:  5 hours Injury: yes   Mechanism of injury: fall   Fall:    Fall occurred:  Down stairs   Height of fall:  49ft   Impact surface:  Hard floor   Point of impact:  Knees   Entrapped after fall: no   Knee location:  R knee Pain details:    Quality:  Sharp   Radiates to:  Does not radiate   Severity:  Mild   Onset quality:  Sudden   Duration:  5 hours   Timing:  Constant   Progression:  Unchanged Chronicity:  New Dislocation: no   Foreign body present:  No foreign bodies Prior injury to area:  No Relieved by:  Rest Worsened by:  Bearing weight Ineffective treatments:  None tried Associated symptoms: decreased ROM and swelling   Associated symptoms: no back pain, no fever, no numbness, no stiffness and no tingling   Risk factors: no obesity        Past Medical History:  Diagnosis Date  . Erectile dysfunction   . Hyperlipidemia   . Hypertension     Patient Active Problem List   Diagnosis Date Noted  . HTN (hypertension) 10/07/2012  . Other and unspecified hyperlipidemia 10/07/2012  . ED (erectile dysfunction) 10/07/2012    History reviewed. No pertinent surgical history.     Family History  Problem  Relation Age of Onset  . Diabetes Mother   . Hypertension Mother     Social History   Tobacco Use  . Smoking status: Never Smoker  . Smokeless tobacco: Never Used  Substance Use Topics  . Alcohol use: Yes    Alcohol/week: 2.0 standard drinks    Types: 2 Glasses of wine per week  . Drug use: No    Home Medications Prior to Admission medications   Medication Sig Start Date End Date Taking? Authorizing Provider  aspirin 81 MG tablet Take 81 mg by mouth daily.    [provider]  atenolol (TENORMIN) 25 MG tablet Take 1 tablet (25 mg total) by mouth daily. PATIENT NEEDS OFFICE VISIT FOR ADDITIONAL REFILLS    Weber, Dema Severin, PA-C  cyclobenzaprine (FLEXERIL) 10 MG tablet Take 1 tablet (10 mg total) by mouth 3 (three) times daily as needed for muscle spasms. 09/15/14   Carmelina Dane, MD  lisinopril-hydrochlorothiazide (PRINZIDE,ZESTORETIC) 20-12.5 MG per tablet TAKE 1 TABLET BY MOUTH 2 (TWO) TIMES DAILY. 11/22/13   Porfirio Oar, PA  NIFEdipine (PROCARDIA XL/ADALAT-CC) 60 MG 24 hr tablet Take 1 tablet (60 mg total) by mouth daily. PATIENT NEEDS OFFICE VISIT FOR ADDITIONAL REFILLS    Tonye Pearson, MD  rosuvastatin (CRESTOR) 10  MG tablet Take 1 tablet (10 mg total) by mouth daily. NEED VISIT, LABS!!    Elsie Stain E, PA-C  triamcinolone cream (KENALOG) 0.1 % Apply topically 3 (three) times daily. X 2 weeks Patient not taking: Reported on 09/15/2014 03/05/13   Hamilton Capri P, DO  VIAGRA 100 MG tablet TAKE 1 TABLET PRIOR TO INTERCOURSE AS DIRECTED 08/10/13   Valarie Cones, Dema Severin, PA-C    Allergies    Patient has no known allergies.  Review of Systems   Review of Systems  Constitutional: Negative for chills and fever.  HENT: Negative for ear pain and sore throat.   Eyes: Negative for pain and visual disturbance.  Respiratory: Negative for cough and shortness of breath.   Cardiovascular: Negative for chest pain and palpitations.  Gastrointestinal: Negative for abdominal pain and  vomiting.  Genitourinary: Negative for dysuria and hematuria.  Musculoskeletal: Positive for gait problem and joint swelling. Negative for arthralgias, back pain and stiffness.  Skin: Negative for color change and rash.  Neurological: Negative for seizures and syncope.  All other systems reviewed and are negative.   Physical Exam Updated Vital Signs BP (!) 161/81 (BP Location: Right Arm)   Pulse (!) 101   Temp 98.4 F (36.9 C) (Oral)   Resp 18   SpO2 98%   Physical Exam Vitals and nursing note reviewed.  Constitutional:      General: He is awake. He is not in acute distress.    Appearance: Normal appearance. He is well-developed, well-groomed and well-nourished. He is not ill-appearing.  HENT:     Head: Normocephalic and atraumatic.     Right Ear: External ear normal.     Left Ear: External ear normal.  Eyes:     General: No scleral icterus.       Right eye: No discharge.        Left eye: No discharge.     Conjunctiva/sclera: Conjunctivae normal.  Cardiovascular:     Rate and Rhythm: Normal rate and regular rhythm.  Pulmonary:     Effort: Pulmonary effort is normal. No respiratory distress.  Musculoskeletal:        General: Swelling present. No edema.     Cervical back: Neck supple.     Comments: Swelling noted to superior aspect of R patella with soft issue defect. Unable to extend R lower leg. NVI distal to injury.  Skin:    General: Skin is warm and dry.     Capillary Refill: Capillary refill takes less than 2 seconds.     Findings: No rash.  Neurological:     General: No focal deficit present.     Mental Status: He is alert and oriented to person, place, and time.     Sensory: No sensory deficit.     Motor: No weakness.  Psychiatric:        Mood and Affect: Mood and affect and mood normal.        Behavior: Behavior normal. Behavior is cooperative.    ED Results / Procedures / Treatments   Labs (all labs ordered are listed, but only abnormal results are  displayed) Labs Reviewed  CBG MONITORING, ED - Abnormal; Notable for the following components:      Result Value   Glucose-Capillary 302 (*)    All other components within normal limits    EKG EKG Interpretation  Date/Time:  Monday September 12 2020 19:12:58 EST Ventricular Rate:  79 PR Interval:  200 QRS Duration: 100 QT Interval:  398  QTC Calculation: 456 R Axis:   -54 Text Interpretation: Normal sinus rhythm Left anterior fascicular block Minimal voltage criteria for LVH, may be normal variant ( R in aVL ) Abnormal ECG No old tracing to compare Confirmed by Eber Hong ([id number]) on 09/12/2020 7:50:49 PM   Radiology DG Knee Complete 4 Views Right  Result Date: 09/12/2020 CLINICAL DATA:  Right knee pain after fall EXAM: RIGHT KNEE - COMPLETE 4+ VIEW COMPARISON:  None. FINDINGS: 1.5 cm mineralized density projects within the anterior soft tissues of the knee approximately 3.0 cm superior to the patella, nonspecific but could reflect a bony avulsion fracture fragment related to quadriceps tendon injury. Osseous structures are otherwise intact. Mild medial compartment osteoarthritis. Small knee joint effusion. Soft tissue swelling anteriorly. Vascular calcifications are noted. IMPRESSION: 1. Small mineralized density projects within the anterior soft tissues of the knee superior to the patella, nonspecific but could reflect a bony avulsion fracture fragment related to quadriceps tendon injury. 2. Small knee joint effusion. 3. Mild medial compartment osteoarthritis. Electronically Signed   By: Duanne Guess D.O.   On: 09/12/2020 15:18    Procedures Procedures  Medications Ordered in ED Medications - No data to display  ED Course  I have reviewed the triage vital signs and the nursing notes.  Pertinent labs & imaging results that were available during my care of the patient were reviewed by me and considered in my medical decision making (see chart for details).    MDM  Rules/Calculators/A&P                          Patient is a [phone] with history and physical as described above who presents to the ED for R knee pain and swelling. VS reassuring and HDS. Patient resting comfortably and in no acute distress. Physical exam consistent with quadriceps tendon rupture with possible patellar avulsion fracture on plain films. HPI and physical exam not consistent with gout/pseudogout, septic arthritis, dislocation, neurovascular injury, or DVT. Knee immobilizer and crutches provided in ED. Instructed patient to follow up in Ortho clinic this week. Discussed patient with on-call Orthopedist to inform about follow up. Patient otherwise remained HDS and no acute events during ED course.  Strict return precautions provided and discussed. Questions and concerns were addressed. Patient verbalized understanding and amenable with discharge plan. Patient discharged in stable condition. Final Clinical Impression(s) / ED Diagnoses Final diagnoses:  Rupture of right quadriceps tendon, initial encounter    Rx / DC Orders ED Discharge Orders    None       Tonia Brooms, MD 09/13/20 Kandee Keen    Eber Hong, MD 09/13/20 1454

## 2020-09-12 NOTE — ED Notes (Signed)
Patient had s syncopal episode /diaphoretic and clammy while at waiting area , CBG= 302.

## 2020-09-13 NOTE — Progress Notes (Signed)
Orthopedic Tech Progress Note Patient Details:  Jerry Rangel Apr 17, 1951 [id number]  Ortho Devices Type of Ortho Device: Knee Immobilizer,Crutches Ortho Device/Splint Location: rle Ortho Device/Splint Interventions: Ordered,Application,Adjustment   Post Interventions Patient Tolerated: Well Instructions Provided: Care of device,Adjustment of device   Trinna Post 09/13/2020, 12:35 AM

## 2020-09-23 DIAGNOSIS — S76101A Unspecified injury of right quadriceps muscle, fascia and tendon, initial encounter: Secondary | ICD-10-CM | POA: Diagnosis not present

## 2020-09-28 ENCOUNTER — Encounter: Payer: Self-pay | Admitting: Family Medicine

## 2020-09-28 ENCOUNTER — Other Ambulatory Visit: Payer: Self-pay

## 2020-09-28 ENCOUNTER — Ambulatory Visit: Payer: Medicare PPO | Admitting: Family Medicine

## 2020-09-28 VITALS — BP 130/68 | HR 70 | Temp 98.7°F | Ht 72.5 in | Wt 167.6 lb

## 2020-09-28 DIAGNOSIS — T783XXA Angioneurotic edema, initial encounter: Secondary | ICD-10-CM | POA: Diagnosis not present

## 2020-09-28 DIAGNOSIS — I10 Essential (primary) hypertension: Secondary | ICD-10-CM

## 2020-09-28 MED ORDER — NIFEDIPINE ER OSMOTIC RELEASE 90 MG PO TB24: 90.0000 mg | ORAL_TABLET | Freq: Every day | ORAL | 1 refills | Status: DC

## 2020-09-28 MED ORDER — HYDROCHLOROTHIAZIDE 25 MG PO TABS: 25.0000 mg | ORAL_TABLET | Freq: Every day | ORAL | 1 refills | Status: DC

## 2020-09-28 NOTE — Patient Instructions (Addendum)
Swelling of your lip is likely due to a condition called angioedema.  It is possible that your lisinopril may be the cause, less likely Metformin.  However I would avoid both Metformin and lisinopril at this time.  I did write for a new blood pressure medication hydrochlorothiazide which was in your previous lisinopril combo pill.  I also increased her nifedipine slightly to help with stopping the lisinopril.  Call your primary care provider and let them know about your current symptoms, but I would like you to be evaluated within the next 5 days if possible at their office.  If you have any increased swelling including anything inside the mouth such as tongue swelling, sore throat, scratchy throat, difficulty swallowing, or any new respiratory symptoms, be seen in the emergency room right away.    Angioedema Angioedema is swelling in the body. The swelling can occur in any part of the body. It often happens on the skin. It may cause itchy, bumpy patches (hives) to form. This condition may:  Occur only one time.  Happen more than one time. It can also stop at any time.  Keep coming back for a number of years. Someday it may stop. What are the causes? This condition may be caused by:  Foods, such as milk, eggs, shellfish, wheat, or nuts.  Medicines, such as ACE inhibitors, antibiotics, NSAIDs, birth control pills, or dyes used in X-rays. Hereditary angioedema (HAE) is passed from parent to child. Symptoms can occur because of:  Illness, infection, or stress.  Changes in hormones.  Exercise.  Minor surgery.  Dental work. In some cases, the cause of this condition may not be known. What increases the risk? You are more likely to have HAE if you have family members with this condition. What are the signs or symptoms? Symptoms of this condition include:  Swollen skin.  Red, itchy patches of skin.  Pain, pressure, or tenderness in the affected area.  Swollen eyelids, face, lips, or  tongue.  Wheezing.  Trouble drinking, swallowing, or closing the mouth completely.  Being hoarse or having a sore throat.  Problems breathing. If your organs are affected:  You may feel like vomiting.  You may have pain in your belly.  You may vomit or have watery poop (diarrhea).  You may have trouble swallowing.  You may have trouble peeing.   How is this treated? To treat this condition, you may be told:  To avoid things that cause attacks (triggers). These include foods or things that cause allergies.  To stop medicines that cause the condition.  To take medicines to treat the condition. In very bad cases, a breathing tube or a machine that helps with breathing (ventilator) may be used. Follow these instructions at home:  Take all medicines only as told by your doctor.  If you were given medicines to treat allergies, always carry them with you.  Wear a medical bracelet as told by your doctor.  Avoid the things that cause attacks. These may include: ? Foods. ? Things in your environment (such as pollen). ? Stress. ? Exercise.  Avoid all medicines that caused the attacks.  Talk to your doctor before you have kids. Some types of this condition may be passed from parent to child.   Where to find more information  American Academy of Allergy Asthma & Immunology: www.aaaai.org Contact a doctor if:  You have another attack.  Your attacks happen more often, even after you take steps to prevent them.  Your  attacks are worse every time they occur.  This condition was passed to you by your parents and you want to have kids. Get help right away if:  Your mouth, tongue, or lips get very swollen.  Your swelling becomes worse.  You have trouble breathing.  You have trouble swallowing.  You have trouble talking.  You have chest pain or you feel dizzy.  You faint. These symptoms may be an emergency. Do not wait to see if the symptoms will go away. Get help  right away. Call your local emergency services (911 in the [place] Do not drive yourself to the hospital. Summary  Angioedema is swelling that can happen in any part of the body.  It can be caused by the food you eat or the medicines you are taking. It can also be passed from parent to child.  Avoid the things that cause your attacks. These can be food, medicines, or things in your environment.  If you were given medicines for allergies, always carry them with you.  Get help right away if your mouth, tongue, or lips get swollen. Also, get help right away if you have trouble breathing or swallowing. This information is not intended to replace advice given to you by your health care provider. Make sure you discuss any questions you have with your health care provider. Document Revised: 06/23/2019 Document Reviewed: 06/23/2019 Elsevier Patient Education  2021 [employer].     If you have lab work done today you will be contacted with your lab results within the next 2 weeks.  If you have not heard from [place] then please contact us. The fastest way to get your results is to register for My Chart.   IF you received an x-ray today, you will receive an invoice from [facility]. Please contact [facility] at [phone] with questions or concerns regarding your invoice.   IF you received labwork today, you will receive an invoice from [place]. Please contact [facility] at [phone] with questions or concerns regarding your invoice.   Our billing staff will not be able to assist you with questions regarding bills from these companies.  You will be contacted with the lab results as soon as they are available. The fastest way to get your results is to activate your My Chart account. Instructions are located on the last page of this paperwork. If you have not heard from [place] regarding the results in 2 weeks, please contact this office.

## 2020-09-28 NOTE — Progress Notes (Signed)
Subjective:  Patient ID: Jerry Rangel, male    DOB: May 13, 1951  Age: 70 y.o. MRN: [id number]  CC:  Chief Complaint  Patient presents with  . Oral Swelling    Lip swelling 2 day after starting Metformin. Stopped taking metformin yday   PCP: [facility].   HPI Jerry Rangel presents for   Lip swelling:  Started yesterday evening. No prior similar symptoms. No tongue swelling, no tightness in throat. No trouble swallowing, no dyspnea.   Started metformin 4 days ago.  On lisinopril HCTZ BID for years for HTN. Also takes nifedipine, atenolol- takes 1/2 pill.   BP Readings from Last 3 Encounters:  09/28/20 130/68  09/12/20 (!) 161/81  09/15/14 124/68   Lab Results  Component Value Date   CREATININE 1.21 10/07/2012     History Patient Active Problem List   Diagnosis Date Noted  . HTN (hypertension) 10/07/2012  . Other and unspecified hyperlipidemia 10/07/2012  . ED (erectile dysfunction) 10/07/2012   Past Medical History:  Diagnosis Date  . Erectile dysfunction   . Hyperlipidemia   . Hypertension    No past surgical history on file. No Known Allergies Prior to Admission medications   Medication Sig Start Date End Date Taking? Authorizing Provider  aspirin 81 MG tablet Take 81 mg by mouth daily.   Yes [provider]  atenolol (TENORMIN) 25 MG tablet Take 1 tablet (25 mg total) by mouth daily. PATIENT NEEDS OFFICE VISIT FOR ADDITIONAL REFILLS   Yes Weber, Sarah L, PA-C  lisinopril-hydrochlorothiazide (PRINZIDE,ZESTORETIC) 20-12.5 MG per tablet TAKE 1 TABLET BY MOUTH 2 (TWO) TIMES DAILY. 11/22/13  Yes Jeffery, Chelle, PA  NIFEdipine (PROCARDIA XL/ADALAT-CC) 60 MG 24 hr tablet Take 1 tablet (60 mg total) by mouth daily. PATIENT NEEDS OFFICE VISIT FOR ADDITIONAL REFILLS   Yes Tonye Pearson, MD  rosuvastatin (CRESTOR) 10 MG tablet Take 1 tablet (10 mg total) by mouth daily. NEED VISIT, LABS!!   Yes Elsie Stain E, PA-C  VIAGRA 100 MG tablet TAKE 1 TABLET  PRIOR TO INTERCOURSE AS DIRECTED 08/10/13  Yes Weber, Dema Severin, PA-C   Social History   Socioeconomic History  . Marital status: Married    Spouse name: Not on file  . Number of children: Not on file  . Years of education: Not on file  . Highest education level: Not on file  Occupational History  . Not on file  Tobacco Use  . Smoking status: Never Smoker  . Smokeless tobacco: Never Used  Substance and Sexual Activity  . Alcohol use: Yes    Alcohol/week: 2.0 standard drinks    Types: 2 Glasses of wine per week  . Drug use: No  . Sexual activity: Yes  Other Topics Concern  . Not on file  Social History Narrative  . Not on file   Social Determinants of Health   Financial Resource Strain: Not on file  Food Insecurity: Not on file  Transportation Needs: Not on file  Physical Activity: Not on file  Stress: Not on file  Social Connections: Not on file  Intimate Partner Violence: Not on file   Review of Systems  Per HPI.  Objective:   Vitals:   09/28/20 1126  BP: 130/68  Pulse: 70  Temp: 98.7 F (37.1 C)  TempSrc: Temporal  SpO2: 100%  Weight: 167 lb 9.6 oz (76 kg)  Height: 6' 0.5" (1.842 m)     Physical Exam Vitals reviewed.  Constitutional:      General: He is  not in acute distress.    Appearance: He is well-developed and well-nourished. He is not ill-appearing or diaphoretic.  HENT:     Head: Normocephalic and atraumatic.     Mouth/Throat:     Comments: Upper greater than lower lip swelling, moist oromucosa, no tongue swelling, posterior oropharynx clear swallowing secretions normally, normal speech, no hoarseness, no stridor, no wheezes, normal respiratory effort, no distress. Cardiovascular:     Rate and Rhythm: Normal rate.  Pulmonary:     Effort: Pulmonary effort is normal. No respiratory distress.     Breath sounds: No stridor. No wheezing or rhonchi.  Musculoskeletal:     Cervical back: Normal range of motion. No tenderness.  Lymphadenopathy:      Cervical: No cervical adenopathy.  Skin:    General: Skin is warm and dry.     Comments: See photo  Neurological:     Mental Status: He is alert and oriented to person, place, and time.  Psychiatric:        Mood and Affect: Mood and affect and mood normal.        Behavior: Behavior normal.        32 minutes spent during visit, greater than 50% counseling and assimilation of information, chart review, and discussion of plan.   Assessment & Plan:  Jerry EndsLeonard Rangel is a 70 y.o. male . Angioedema, initial encounter  Primary hypertension - Plan: NIFEdipine (PROCARDIA XL/NIFEDICAL-XL) 90 MG 24 hr tablet, hydrochlorothiazide (HYDRODIURIL) 25 MG tablet  Angioedema involving lips only.  No oropharyngeal symptoms otherwise, no respiratory symptoms.  ACE inhibitor likely culprit.  Less likely Metformin although that was recently started.  Hold Metformin for now, stop lisinopril, ordered HCTZ at equivalent dose, increase nifedipine slightly.  Close follow-up with primary care provider discussed for blood pressure management as well as to discuss current meds.  Avoid ACE inhibitor's in the future.  ER precautions given if any oral symptoms, respiratory symptoms or not improving next few days off ACE.  Understanding expressed.  Meds ordered this encounter  Medications  . NIFEdipine (PROCARDIA XL/NIFEDICAL-XL) 90 MG 24 hr tablet    Sig: Take 1 tablet (90 mg total) by mouth daily.    Dispense:  30 tablet    Refill:  1  . hydrochlorothiazide (HYDRODIURIL) 25 MG tablet    Sig: Take 1 tablet (25 mg total) by mouth daily.    Dispense:  30 tablet    Refill:  1   Patient Instructions   Swelling of your lip is likely due to a condition called angioedema.  It is possible that your lisinopril may be the cause, less likely Metformin.  However I would avoid both Metformin and lisinopril at this time.  I did write for a new blood pressure medication hydrochlorothiazide which was in your previous lisinopril  combo pill.  I also increased her nifedipine slightly to help with stopping the lisinopril.  Call your primary care provider and let them know about your current symptoms, but I would like you to be evaluated within the next 5 days if possible at their office.  If you have any increased swelling including anything inside the mouth such as tongue swelling, sore throat, scratchy throat, difficulty swallowing, or any new respiratory symptoms, be seen in the emergency room right away.    Angioedema Angioedema is swelling in the body. The swelling can occur in any part of the body. It often happens on the skin. It may cause itchy, bumpy patches (hives) to form. This  condition may:  Occur only one time.  Happen more than one time. It can also stop at any time.  Keep coming back for a number of years. Someday it may stop. What are the causes? This condition may be caused by:  Foods, such as milk, eggs, shellfish, wheat, or nuts.  Medicines, such as ACE inhibitors, antibiotics, NSAIDs, birth control pills, or dyes used in X-rays. Hereditary angioedema (HAE) is passed from parent to child. Symptoms can occur because of:  Illness, infection, or stress.  Changes in hormones.  Exercise.  Minor surgery.  Dental work. In some cases, the cause of this condition may not be known. What increases the risk? You are more likely to have HAE if you have family members with this condition. What are the signs or symptoms? Symptoms of this condition include:  Swollen skin.  Red, itchy patches of skin.  Pain, pressure, or tenderness in the affected area.  Swollen eyelids, face, lips, or tongue.  Wheezing.  Trouble drinking, swallowing, or closing the mouth completely.  Being hoarse or having a sore throat.  Problems breathing. If your organs are affected:  You may feel like vomiting.  You may have pain in your belly.  You may vomit or have watery poop (diarrhea).  You may have trouble  swallowing.  You may have trouble peeing.   How is this treated? To treat this condition, you may be told:  To avoid things that cause attacks (triggers). These include foods or things that cause allergies.  To stop medicines that cause the condition.  To take medicines to treat the condition. In very bad cases, a breathing tube or a machine that helps with breathing (ventilator) may be used. Follow these instructions at home:  Take all medicines only as told by your doctor.  If you were given medicines to treat allergies, always carry them with you.  Wear a medical bracelet as told by your doctor.  Avoid the things that cause attacks. These may include: ? Foods. ? Things in your environment (such as pollen). ? Stress. ? Exercise.  Avoid all medicines that caused the attacks.  Talk to your doctor before you have kids. Some types of this condition may be passed from parent to child.   Where to find more information  American Academy of Allergy Asthma & Immunology: www.aaaai.org Contact a doctor if:  You have another attack.  Your attacks happen more often, even after you take steps to prevent them.  Your attacks are worse every time they occur.  This condition was passed to you by your parents and you want to have kids. Get help right away if:  Your mouth, tongue, or lips get very swollen.  Your swelling becomes worse.  You have trouble breathing.  You have trouble swallowing.  You have trouble talking.  You have chest pain or you feel dizzy.  You faint. These symptoms may be an emergency. Do not wait to see if the symptoms will go away. Get help right away. Call your local emergency services (911 in the [place] Do not drive yourself to the hospital. Summary  Angioedema is swelling that can happen in any part of the body.  It can be caused by the food you eat or the medicines you are taking. It can also be passed from parent to child.  Avoid the things  that cause your attacks. These can be food, medicines, or things in your environment.  If you were given medicines for allergies, always  carry them with you.  Get help right away if your mouth, tongue, or lips get swollen. Also, get help right away if you have trouble breathing or swallowing. This information is not intended to replace advice given to you by your health care provider. Make sure you discuss any questions you have with your health care provider. Document Revised: 06/23/2019 Document Reviewed: 06/23/2019 Elsevier Patient Education  2021 [employer].     If you have lab work done today you will be contacted with your lab results within the next 2 weeks.  If you have not heard from [place] then please contact us. The fastest way to get your results is to register for My Chart.   IF you received an x-ray today, you will receive an invoice from [facility]. Please contact [facility] at [phone] with questions or concerns regarding your invoice.   IF you received labwork today, you will receive an invoice from [place]. Please contact [facility] at [phone] with questions or concerns regarding your invoice.   Our billing staff will not be able to assist you with questions regarding bills from these companies.  You will be contacted with the lab results as soon as they are available. The fastest way to get your results is to activate your My Chart account. Instructions are located on the last page of this paperwork. If you have not heard from [place] regarding the results in 2 weeks, please contact this office.          Signed, Meredith Staggers, MD [facility]

## 2020-09-29 ENCOUNTER — Other Ambulatory Visit: Payer: Self-pay | Admitting: Family Medicine

## 2020-09-29 DIAGNOSIS — I10 Essential (primary) hypertension: Secondary | ICD-10-CM

## 2020-09-29 DIAGNOSIS — T464X5A Adverse effect of angiotensin-converting-enzyme inhibitors, initial encounter: Secondary | ICD-10-CM | POA: Diagnosis not present

## 2020-09-29 DIAGNOSIS — T783XXA Angioneurotic edema, initial encounter: Secondary | ICD-10-CM | POA: Diagnosis not present

## 2020-10-03 DIAGNOSIS — S76111A Strain of right quadriceps muscle, fascia and tendon, initial encounter: Secondary | ICD-10-CM | POA: Diagnosis not present

## 2020-10-03 DIAGNOSIS — G8918 Other acute postprocedural pain: Secondary | ICD-10-CM | POA: Diagnosis not present

## 2020-10-21 ENCOUNTER — Other Ambulatory Visit: Payer: Self-pay | Admitting: Family Medicine

## 2020-10-21 DIAGNOSIS — I10 Essential (primary) hypertension: Secondary | ICD-10-CM

## 2020-10-21 NOTE — Telephone Encounter (Signed)
  Last refill:  3/2/202  Future visit scheduled:no  Notes to clinic:  due for follow up  Review for refill    Requested Prescriptions  Pending Prescriptions Disp Refills   NIFEdipine (PROCARDIA XL/NIFEDICAL-XL) 90 MG 24 hr tablet [Pharmacy Med Name: NIFEDIPINE ER 90 MG TABLET] 30 tablet 1    Sig: TAKE 1 TABLET BY MOUTH EVERY DAY      Cardiovascular:  Calcium Channel Blockers Failed - 10/21/2020  9:32 AM      Failed - Valid encounter within last 6 months    Recent Outpatient Visits           3 weeks ago Angioedema, initial encounter   [facility] at [facility], Asencion Partridge, MD   6 years ago Sciatic neuritis, left   [facility], Tessa Lerner, MD   7 years ago Dermatitis   Primary Care at [facility], [place] P, DO   8 years ago HTN (hypertension)   Primary [facility] at [facility], Harrel Lemon, MD   8 years ago Pain in limb   Primary Care at [facility], Thao P, DO                Passed - Last BP in normal range    BP Readings from Last 1 Encounters:  09/28/20 130/68

## 2020-10-24 DIAGNOSIS — Z888 Allergy status to other drugs, medicaments and biological substances status: Secondary | ICD-10-CM | POA: Diagnosis not present

## 2020-10-24 DIAGNOSIS — E1122 Type 2 diabetes mellitus with diabetic chronic kidney disease: Secondary | ICD-10-CM | POA: Diagnosis not present

## 2020-10-24 DIAGNOSIS — I10 Essential (primary) hypertension: Secondary | ICD-10-CM | POA: Diagnosis not present

## 2020-10-24 DIAGNOSIS — D508 Other iron deficiency anemias: Secondary | ICD-10-CM | POA: Diagnosis not present

## 2020-10-24 DIAGNOSIS — R634 Abnormal weight loss: Secondary | ICD-10-CM | POA: Diagnosis not present

## 2020-10-24 DIAGNOSIS — D649 Anemia, unspecified: Secondary | ICD-10-CM | POA: Diagnosis not present

## 2020-10-25 DIAGNOSIS — E1122 Type 2 diabetes mellitus with diabetic chronic kidney disease: Secondary | ICD-10-CM | POA: Diagnosis not present

## 2020-10-25 DIAGNOSIS — I10 Essential (primary) hypertension: Secondary | ICD-10-CM | POA: Diagnosis not present

## 2020-11-10 DIAGNOSIS — S76111A Strain of right quadriceps muscle, fascia and tendon, initial encounter: Secondary | ICD-10-CM | POA: Diagnosis not present

## 2020-11-16 DIAGNOSIS — E7800 Pure hypercholesterolemia, unspecified: Secondary | ICD-10-CM | POA: Diagnosis not present

## 2020-11-16 DIAGNOSIS — Z0000 Encounter for general adult medical examination without abnormal findings: Secondary | ICD-10-CM | POA: Diagnosis not present

## 2020-11-16 DIAGNOSIS — E039 Hypothyroidism, unspecified: Secondary | ICD-10-CM | POA: Diagnosis not present

## 2020-11-16 DIAGNOSIS — E1122 Type 2 diabetes mellitus with diabetic chronic kidney disease: Secondary | ICD-10-CM | POA: Diagnosis not present

## 2020-11-17 ENCOUNTER — Other Ambulatory Visit: Payer: Self-pay | Admitting: Family Medicine

## 2020-11-17 DIAGNOSIS — I10 Essential (primary) hypertension: Secondary | ICD-10-CM

## 2020-11-23 DIAGNOSIS — I10 Essential (primary) hypertension: Secondary | ICD-10-CM | POA: Diagnosis not present

## 2020-11-23 DIAGNOSIS — E039 Hypothyroidism, unspecified: Secondary | ICD-10-CM | POA: Diagnosis not present

## 2020-11-23 DIAGNOSIS — N182 Chronic kidney disease, stage 2 (mild): Secondary | ICD-10-CM | POA: Diagnosis not present

## 2020-11-23 DIAGNOSIS — Z0000 Encounter for general adult medical examination without abnormal findings: Secondary | ICD-10-CM | POA: Diagnosis not present

## 2020-11-23 DIAGNOSIS — F5101 Primary insomnia: Secondary | ICD-10-CM | POA: Diagnosis not present

## 2020-11-23 DIAGNOSIS — D62 Acute posthemorrhagic anemia: Secondary | ICD-10-CM | POA: Diagnosis not present

## 2020-12-06 DIAGNOSIS — E1122 Type 2 diabetes mellitus with diabetic chronic kidney disease: Secondary | ICD-10-CM | POA: Diagnosis not present

## 2020-12-06 DIAGNOSIS — I10 Essential (primary) hypertension: Secondary | ICD-10-CM | POA: Diagnosis not present

## 2020-12-09 DIAGNOSIS — S76111D Strain of right quadriceps muscle, fascia and tendon, subsequent encounter: Secondary | ICD-10-CM | POA: Diagnosis not present

## 2020-12-13 DIAGNOSIS — S76111D Strain of right quadriceps muscle, fascia and tendon, subsequent encounter: Secondary | ICD-10-CM | POA: Diagnosis not present

## 2020-12-15 DIAGNOSIS — S76111D Strain of right quadriceps muscle, fascia and tendon, subsequent encounter: Secondary | ICD-10-CM | POA: Diagnosis not present

## 2020-12-19 DIAGNOSIS — S76111D Strain of right quadriceps muscle, fascia and tendon, subsequent encounter: Secondary | ICD-10-CM | POA: Diagnosis not present

## 2020-12-21 DIAGNOSIS — S76111D Strain of right quadriceps muscle, fascia and tendon, subsequent encounter: Secondary | ICD-10-CM | POA: Diagnosis not present

## 2020-12-21 DIAGNOSIS — E1122 Type 2 diabetes mellitus with diabetic chronic kidney disease: Secondary | ICD-10-CM | POA: Diagnosis not present

## 2020-12-21 DIAGNOSIS — I10 Essential (primary) hypertension: Secondary | ICD-10-CM | POA: Diagnosis not present

## 2020-12-21 DIAGNOSIS — E7800 Pure hypercholesterolemia, unspecified: Secondary | ICD-10-CM | POA: Diagnosis not present

## 2020-12-21 DIAGNOSIS — F5101 Primary insomnia: Secondary | ICD-10-CM | POA: Diagnosis not present

## 2020-12-23 DIAGNOSIS — S76111D Strain of right quadriceps muscle, fascia and tendon, subsequent encounter: Secondary | ICD-10-CM | POA: Diagnosis not present

## 2020-12-29 DIAGNOSIS — I10 Essential (primary) hypertension: Secondary | ICD-10-CM | POA: Diagnosis not present

## 2020-12-29 DIAGNOSIS — E1122 Type 2 diabetes mellitus with diabetic chronic kidney disease: Secondary | ICD-10-CM | POA: Diagnosis not present

## 2020-12-30 DIAGNOSIS — I10 Essential (primary) hypertension: Secondary | ICD-10-CM | POA: Diagnosis not present

## 2021-01-06 DIAGNOSIS — S76111D Strain of right quadriceps muscle, fascia and tendon, subsequent encounter: Secondary | ICD-10-CM | POA: Diagnosis not present

## 2021-01-11 DIAGNOSIS — S76111D Strain of right quadriceps muscle, fascia and tendon, subsequent encounter: Secondary | ICD-10-CM | POA: Diagnosis not present

## 2021-01-13 DIAGNOSIS — S76111D Strain of right quadriceps muscle, fascia and tendon, subsequent encounter: Secondary | ICD-10-CM | POA: Diagnosis not present

## 2021-01-17 DIAGNOSIS — S76111D Strain of right quadriceps muscle, fascia and tendon, subsequent encounter: Secondary | ICD-10-CM | POA: Diagnosis not present

## 2021-01-19 DIAGNOSIS — S76111D Strain of right quadriceps muscle, fascia and tendon, subsequent encounter: Secondary | ICD-10-CM | POA: Diagnosis not present

## 2021-01-23 DIAGNOSIS — S76111D Strain of right quadriceps muscle, fascia and tendon, subsequent encounter: Secondary | ICD-10-CM | POA: Diagnosis not present

## 2021-01-26 DIAGNOSIS — S76111D Strain of right quadriceps muscle, fascia and tendon, subsequent encounter: Secondary | ICD-10-CM | POA: Diagnosis not present

## 2021-01-31 ENCOUNTER — Other Ambulatory Visit: Payer: Self-pay

## 2021-01-31 ENCOUNTER — Emergency Department
Admission: RE | Admit: 2021-01-31 | Discharge: 2021-01-31 | Disposition: A | Discharge status: Home / Self Care | Payer: Medicare PPO | Source: Ambulatory Visit | Attending: Family Medicine | Admitting: Family Medicine

## 2021-01-31 VITALS — BP 155/79 | HR 87 | Temp 98.2°F | Resp 18

## 2021-01-31 DIAGNOSIS — M7061 Trochanteric bursitis, right hip: Secondary | ICD-10-CM | POA: Diagnosis not present

## 2021-01-31 MED ORDER — PREDNISONE 10 MG (21) PO TBPK: ORAL_TABLET | Freq: Every day | ORAL | 0 refills | Status: DC

## 2021-01-31 NOTE — Discharge Instructions (Addendum)
Put ice on the painful hip area for 20 minutes every couple of hours Limit walking while it is painful Take prednisone as directed.  Take all of day 1 today. Follow-up with your orthopedic and with physical therapy

## 2021-01-31 NOTE — ED Triage Notes (Signed)
Pt is present today with lower back pain and right leg pain. Pt states that he noticed the pain two days ago. Pt describes the pain as being a dull/sharp pain radiating down his leg

## 2021-01-31 NOTE — ED Provider Notes (Signed)
[facility]    CSN: [id number] Arrival date & time: 01/31/21  1150      History   Chief Complaint Chief Complaint  Patient presents with   Back Pain   Leg Pain    RIGHT     HPI Jerry Rangel is a 70 y.o. male.   HPI Patient is here for right hip pain.  It radiates down the outside of his leg.  He states its been going on for the last 2 days.  No accident.  No injury.  He states he did do some twisting activities over the weekend when he lifted bags of ice and put them in his truck.  He states that he has well-controlled hypertension and diabetes.  He recently had a knee replacement on the right.  He has progressed well.  He is still in physical therapy.  Past Medical History:  Diagnosis Date   Erectile dysfunction    Hyperlipidemia    Hypertension     Patient Active Problem List   Diagnosis Date Noted   HTN (hypertension) 10/07/2012   Other and unspecified hyperlipidemia 10/07/2012   ED (erectile dysfunction) 10/07/2012    Past Surgical History:  Procedure Laterality Date   KNEE SURGERY Right        Home Medications    Prior to Admission medications   Medication Sig Start Date End Date Taking? Authorizing Provider  metFORMIN (GLUCOPHAGE) 500 MG tablet Take by mouth 2 (two) times daily with a meal.   Yes [provider]  predniSONE (STERAPRED UNI-PAK 21 TAB) 10 MG (21) TBPK tablet Take by mouth daily. tad 01/31/21  Yes Eustace Moore, MD  aspirin 81 MG tablet Take 81 mg by mouth daily.    [provider]  atenolol (TENORMIN) 25 MG tablet Take 1 tablet (25 mg total) by mouth daily. PATIENT NEEDS OFFICE VISIT FOR ADDITIONAL REFILLS    Weber, Dema Severin, PA-C  hydrochlorothiazide (HYDRODIURIL) 25 MG tablet TAKE 1 TABLET (25 MG TOTAL) BY MOUTH DAILY. 09/29/20   Shade Flood, MD  NIFEdipine (PROCARDIA XL/NIFEDICAL-XL) 90 MG 24 hr tablet TAKE 1 TABLET BY MOUTH EVERY DAY 10/21/20   Shade Flood, MD  [facility] VERIO test strip as  directed. 09/07/20   [provider]  rosuvastatin (CRESTOR) 10 MG tablet Take 1 tablet (10 mg total) by mouth daily. NEED VISIT, LABS!!    Elsie Stain E, PA-C  traZODone (DESYREL) 50 MG tablet Take 50-100 mg by mouth at bedtime as needed. 12/19/20   [provider]  VIAGRA 100 MG tablet TAKE 1 TABLET PRIOR TO INTERCOURSE AS DIRECTED 08/10/13   Valarie Cones, Dema Severin, PA-C    Family History Family History  Problem Relation Age of Onset   Diabetes Mother    Hypertension Mother     Social History Social History   Tobacco Use   Smoking status: Never   Smokeless tobacco: Never  Substance Use Topics   Alcohol use: Yes    Alcohol/week: 2.0 standard drinks    Types: 2 Glasses of wine per week   Drug use: No     Allergies   Ace inhibitors   Review of Systems Review of Systems See HPI  Physical Exam Triage Vital Signs ED Triage Vitals  Enc Vitals Group     BP 01/31/21 1250 (!) 155/79     Pulse Rate 01/31/21 1250 87     Resp 01/31/21 1250 18     Temp 01/31/21 1250 98.2 F (36.8 C)  Temp Source 01/31/21 1250 Oral     SpO2 01/31/21 1250 100 %     Weight --      Height --      Head Circumference --      Peak Flow --      Pain Score 01/31/21 1247 7     Pain Loc --      Pain Edu? --      Excl. in GC? --        Physical Exam Constitutional:      General: He is not in acute distress.    Appearance: He is well-developed.     Comments: Antalgic gait.  Using cane.  Lean  HENT:     Head: Normocephalic and atraumatic.  Eyes:     Conjunctiva/sclera: Conjunctivae normal.     Pupils: Pupils are equal, round, and reactive to light.  Cardiovascular:     Rate and Rhythm: Normal rate.  Pulmonary:     Effort: Pulmonary effort is normal. No respiratory distress.  Abdominal:     General: There is no distension.     Palpations: Abdomen is soft.  Musculoskeletal:        General: Normal range of motion.     Cervical back: Normal range of motion.     Comments: The  back is straight and symmetric.  No tenderness centrally over the SI joints.  There is tenderness over the right greater trochanter.  Mild pain with range of motion of the hip.  Reflexes strength and sensation are normal.  Straight leg raise is negative bilaterally.  Skin:    General: Skin is warm and dry.  Neurological:     Mental Status: He is alert.     Gait: Gait abnormal.     Deep Tendon Reflexes: Reflexes normal.  Psychiatric:        Mood and Affect: Mood normal.        Behavior: Behavior normal.     UC Treatments / Results  Labs (all labs ordered are listed, but only abnormal results are displayed) Labs Reviewed - No data to display  EKG   Radiology No results found.  Procedures Procedures (including critical care time)  Medications Ordered in UC Medications - No data to display  Initial Impression / Assessment and Plan / UC Course  I have reviewed the triage vital signs and the nursing notes.  Pertinent labs & imaging results that were available during my care of the patient were reviewed by me and considered in my medical decision making (see chart for details).     Physical exam supports a diagnosis of hip bursitis more than sciatica.  Straight leg raise is fully negative.  No neurologic findings or symptoms.  We will use steroids.  Ice.  Rest.  Follow-up with physical therapy as scheduled Final Clinical Impressions(s) / UC Diagnoses   Final diagnoses:  Trochanteric bursitis of right hip     Discharge Instructions      Put ice on the painful hip area for 20 minutes every couple of hours Limit walking while it is painful Take prednisone as directed.  Take all of day 1 today. Follow-up with your orthopedic and with physical therapy   ED Prescriptions     Medication Sig Dispense Auth. Provider   predniSONE (STERAPRED UNI-PAK 21 TAB) 10 MG (21) TBPK tablet Take by mouth daily. tad 21 tablet Eustace Moore, MD      PDMP not reviewed this  encounter.   Rica Mast  Fannie Knee, MD 01/31/21 1311

## 2021-02-02 DIAGNOSIS — M25561 Pain in right knee: Secondary | ICD-10-CM | POA: Diagnosis not present

## 2021-02-07 ENCOUNTER — Ambulatory Visit
Admission: RE | Admit: 2021-02-07 | Discharge: 2021-02-07 | Disposition: A | Discharge status: Home / Self Care | Payer: Medicare PPO | Source: Ambulatory Visit | Attending: Student | Admitting: Student

## 2021-02-07 ENCOUNTER — Other Ambulatory Visit: Payer: Self-pay

## 2021-02-07 VITALS — BP 160/83 | HR 84 | Temp 98.1°F | Resp 18

## 2021-02-07 DIAGNOSIS — S39012A Strain of muscle, fascia and tendon of lower back, initial encounter: Secondary | ICD-10-CM | POA: Diagnosis not present

## 2021-02-07 DIAGNOSIS — M7061 Trochanteric bursitis, right hip: Secondary | ICD-10-CM | POA: Diagnosis not present

## 2021-02-07 MED ORDER — TIZANIDINE HCL 2 MG PO CAPS: 2.0000 mg | ORAL_CAPSULE | Freq: Three times a day (TID) | ORAL | 0 refills | Status: DC

## 2021-02-07 NOTE — Discharge Instructions (Addendum)
-  Start the muscle relaxer-Zanaflex (tizanidine), up to 3 times daily for muscle spasms and pain.  This can make you drowsy, so take at bedtime or when you do not need to drive or operate machinery. -Please follow-up with your orthopedist and your physical therapist for further evaluation and management.  If your symptoms persist and you cannot bear the discomfort, call your orthopedist and asked to schedule this appointment sooner. -Seek additional medical attention if new symptoms like numbness of your inner thighs, urinary retention, constipation, worst back pain of your life.

## 2021-02-07 NOTE — ED Provider Notes (Signed)
[facility]    CSN: [id number] Arrival date & time: 02/07/21  0853      History   Chief Complaint Chief Complaint  Patient presents with   Back Pain   Leg Pain    Right     HPI Jerry Rangel is a 70 y.o. male presenting with back pain and leg pain.  He was last seen for his symptoms on 7/5 and at that time was diagnosed with suspected hip bursitis.  He was advised to follow-up with orthopedist and physical therapy, which he has not done.  He does have an orthopedist follow-up scheduled for 1 week.  Continues to endorse right hip pain radiating down the outside of the leg and right lumbar paraspinous muscle tenderness.  Pain for about 2 weeks now, nontraumatic, no overuse or injury.  Does note he lifted some bags of ice recently.  He has had a right knee replacement which is healing well.  Still in physical therapy for this.  HPI  Past Medical History:  Diagnosis Date   Erectile dysfunction    Hyperlipidemia    Hypertension     Patient Active Problem List   Diagnosis Date Noted   HTN (hypertension) 10/07/2012   Other and unspecified hyperlipidemia 10/07/2012   ED (erectile dysfunction) 10/07/2012    Past Surgical History:  Procedure Laterality Date   KNEE SURGERY Right        Home Medications    Prior to Admission medications   Medication Sig Start Date End Date Taking? Authorizing Provider  tizanidine (ZANAFLEX) 2 MG capsule Take 1 capsule (2 mg total) by mouth 3 (three) times daily. 02/07/21  Yes Rhys Martini, PA-C  aspirin 81 MG tablet Take 81 mg by mouth daily.    [provider]  atenolol (TENORMIN) 25 MG tablet Take 1 tablet (25 mg total) by mouth daily. PATIENT NEEDS OFFICE VISIT FOR ADDITIONAL REFILLS    Weber, Dema Severin, PA-C  hydrochlorothiazide (HYDRODIURIL) 25 MG tablet TAKE 1 TABLET (25 MG TOTAL) BY MOUTH DAILY. 09/29/20   Shade Flood, MD  metFORMIN (GLUCOPHAGE) 500 MG tablet Take by mouth 2 (two) times daily with a meal.     [provider]  NIFEdipine (PROCARDIA XL/NIFEDICAL-XL) 90 MG 24 hr tablet TAKE 1 TABLET BY MOUTH EVERY DAY 10/21/20   Shade Flood, MD  [facility] VERIO test strip as directed. 09/07/20   [provider]  rosuvastatin (CRESTOR) 10 MG tablet Take 1 tablet (10 mg total) by mouth daily. NEED VISIT, LABS!!    Elsie Stain E, PA-C  traZODone (DESYREL) 50 MG tablet Take 50-100 mg by mouth at bedtime as needed. 12/19/20   [provider]  VIAGRA 100 MG tablet TAKE 1 TABLET PRIOR TO INTERCOURSE AS DIRECTED 08/10/13   Valarie Cones, Dema Severin, PA-C    Family History Family History  Problem Relation Age of Onset   Diabetes Mother    Hypertension Mother     Social History Social History   Tobacco Use   Smoking status: Never   Smokeless tobacco: Never  Substance Use Topics   Alcohol use: Yes    Alcohol/week: 2.0 standard drinks    Types: 2 Glasses of wine per week   Drug use: No     Allergies   Ace inhibitors   Review of Systems Review of Systems  Constitutional:  Negative for chills, fever and unexpected weight change.  Respiratory:  Negative for chest tightness and shortness of breath.   Cardiovascular:  Negative for chest pain and palpitations.  Gastrointestinal:  Negative for abdominal pain, diarrhea, nausea and vomiting.  Genitourinary:  Negative for decreased urine volume, difficulty urinating and frequency.  Musculoskeletal:  Positive for back pain. Negative for arthralgias, gait problem, joint swelling, myalgias, neck pain and neck stiffness.  Skin:  Negative for wound.  Neurological:  Negative for dizziness, tremors, seizures, syncope, facial asymmetry, speech difficulty, weakness, light-headedness, numbness and headaches.  All other systems reviewed and are negative.   Physical Exam Triage Vital Signs ED Triage Vitals  Enc Vitals Group     BP      Pulse      Resp      Temp      Temp src      SpO2      Weight      Height      Head Circumference       Peak Flow      Pain Score      Pain Loc      Pain Edu?      Excl. in GC?    No data found.  Updated Vital Signs BP (!) 160/83 (BP Location: Left Arm)   Pulse 84   Temp 98.1 F (36.7 C) (Oral)   Resp 18   SpO2 97%   Visual Acuity Right Eye Distance:   Left Eye Distance:   Bilateral Distance:    Right Eye Near:   Left Eye Near:    Bilateral Near:     Physical Exam Vitals reviewed.  Constitutional:      General: He is not in acute distress.    Appearance: Normal appearance. He is not ill-appearing or diaphoretic.  HENT:     Head: Normocephalic and atraumatic.  Cardiovascular:     Rate and Rhythm: Normal rate and regular rhythm.     Heart sounds: Normal heart sounds.  Pulmonary:     Effort: Pulmonary effort is normal.     Breath sounds: Normal breath sounds.  Musculoskeletal:     Comments: Right lumbar paraspinous muscle tenderness to deep palpation.  Pain with palpation over right greater trochanter.  Mild pain with flexion and extension of the right hip.  Reflexes, strength, sensation intact, no saddle anesthesia.  Straight leg raise negative bilaterally.  Gait intact but with pain.  No midline spinous tenderness or deformity.  Skin:    General: Skin is warm.  Neurological:     General: No focal deficit present.     Mental Status: He is alert and oriented to person, place, and time.  Psychiatric:        Mood and Affect: Mood normal.        Behavior: Behavior normal.        Thought Content: Thought content normal.        Judgment: Judgment normal.     UC Treatments / Results  Labs (all labs ordered are listed, but only abnormal results are displayed) Labs Reviewed - No data to display  EKG   Radiology No results found.  Procedures Procedures (including critical care time)  Medications Ordered in UC Medications - No data to display  Initial Impression / Assessment and Plan / UC Course  I have reviewed the triage vital signs and the nursing  notes.  Pertinent labs & imaging results that were available during my care of the patient were reviewed by me and considered in my medical decision making (see chart for details).     This patient is  a very pleasant 69 y.o. year old male presenting with suspected right greater trochanter bursitis and R lumbar paraspinous tenderness. No red flag symptoms. No trauma.  At last visit with [place] he was advised to follow with physical therapy and Ortho, which he has not done yet.  He does have a follow-up with Ortho scheduled for 1 week.  He failed treatment with prednisone, so we will try Zanaflex today.  Again encouraged him to follow-up with Ortho and physical therapy.  Red flag symptoms and ER return precautions discussed.   Final Clinical Impressions(s) / UC Diagnoses   Final diagnoses:  Strain of lumbar region, initial encounter  Trochanteric bursitis of right hip     Discharge Instructions      -Start the muscle relaxer-Zanaflex (tizanidine), up to 3 times daily for muscle spasms and pain.  This can make you drowsy, so take at bedtime or when you do not need to drive or operate machinery. -Please follow-up with your orthopedist and your physical therapist for further evaluation and management.  If your symptoms persist and you cannot bear the discomfort, call your orthopedist and asked to schedule this appointment sooner. -Seek additional medical attention if new symptoms like numbness of your inner thighs, urinary retention, constipation, worst back pain of your life.     ED Prescriptions     Medication Sig Dispense Auth. Provider   tizanidine (ZANAFLEX) 2 MG capsule Take 1 capsule (2 mg total) by mouth 3 (three) times daily. 14 capsule Rhys Martini, PA-C      PDMP not reviewed this encounter.   Rhys Martini, PA-C 02/07/21 [phone]

## 2021-02-07 NOTE — ED Triage Notes (Signed)
One week h/o low back and right leg pain. Pt was seen on 01/31/21 for the same sxs. He was tx with prednisone and reports no decrease in sxs. Ambulates with a cane. Pain is worse at night.  Back pain is greater than leg  pain. Confirms intermittent numbness in RLE. Pt underwent a knee operation in 3/22.

## 2021-02-10 DIAGNOSIS — R634 Abnormal weight loss: Secondary | ICD-10-CM | POA: Diagnosis not present

## 2021-02-10 DIAGNOSIS — I10 Essential (primary) hypertension: Secondary | ICD-10-CM | POA: Diagnosis not present

## 2021-02-10 DIAGNOSIS — E7800 Pure hypercholesterolemia, unspecified: Secondary | ICD-10-CM | POA: Diagnosis not present

## 2021-02-10 DIAGNOSIS — M79651 Pain in right thigh: Secondary | ICD-10-CM | POA: Diagnosis not present

## 2021-02-24 DIAGNOSIS — I10 Essential (primary) hypertension: Secondary | ICD-10-CM | POA: Diagnosis not present

## 2021-02-24 DIAGNOSIS — R634 Abnormal weight loss: Secondary | ICD-10-CM | POA: Diagnosis not present

## 2021-02-24 DIAGNOSIS — M79651 Pain in right thigh: Secondary | ICD-10-CM | POA: Diagnosis not present

## 2021-02-24 DIAGNOSIS — E7800 Pure hypercholesterolemia, unspecified: Secondary | ICD-10-CM | POA: Diagnosis not present

## 2021-03-21 DIAGNOSIS — M25561 Pain in right knee: Secondary | ICD-10-CM | POA: Diagnosis not present

## 2021-03-27 DIAGNOSIS — G4700 Insomnia, unspecified: Secondary | ICD-10-CM | POA: Diagnosis not present

## 2021-03-27 DIAGNOSIS — E1165 Type 2 diabetes mellitus with hyperglycemia: Secondary | ICD-10-CM | POA: Diagnosis not present

## 2021-03-27 DIAGNOSIS — I10 Essential (primary) hypertension: Secondary | ICD-10-CM | POA: Diagnosis not present

## 2021-03-27 DIAGNOSIS — Z008 Encounter for other general examination: Secondary | ICD-10-CM | POA: Diagnosis not present

## 2021-03-27 DIAGNOSIS — E785 Hyperlipidemia, unspecified: Secondary | ICD-10-CM | POA: Diagnosis not present

## 2021-03-27 DIAGNOSIS — R008 Other abnormalities of heart beat: Secondary | ICD-10-CM | POA: Diagnosis not present

## 2021-03-27 DIAGNOSIS — Z0000 Encounter for general adult medical examination without abnormal findings: Secondary | ICD-10-CM | POA: Diagnosis not present

## 2021-03-27 DIAGNOSIS — E11649 Type 2 diabetes mellitus with hypoglycemia without coma: Secondary | ICD-10-CM | POA: Diagnosis not present

## 2021-04-05 DIAGNOSIS — E785 Hyperlipidemia, unspecified: Secondary | ICD-10-CM | POA: Diagnosis not present

## 2021-04-05 DIAGNOSIS — N529 Male erectile dysfunction, unspecified: Secondary | ICD-10-CM | POA: Diagnosis not present

## 2021-04-05 DIAGNOSIS — G4700 Insomnia, unspecified: Secondary | ICD-10-CM | POA: Diagnosis not present

## 2021-04-05 DIAGNOSIS — R32 Unspecified urinary incontinence: Secondary | ICD-10-CM | POA: Diagnosis not present

## 2021-04-05 DIAGNOSIS — Z7982 Long term (current) use of aspirin: Secondary | ICD-10-CM | POA: Diagnosis not present

## 2021-04-05 DIAGNOSIS — Z7984 Long term (current) use of oral hypoglycemic drugs: Secondary | ICD-10-CM | POA: Diagnosis not present

## 2021-04-05 DIAGNOSIS — N189 Chronic kidney disease, unspecified: Secondary | ICD-10-CM | POA: Diagnosis not present

## 2021-04-05 DIAGNOSIS — E1122 Type 2 diabetes mellitus with diabetic chronic kidney disease: Secondary | ICD-10-CM | POA: Diagnosis not present

## 2021-04-05 DIAGNOSIS — I129 Hypertensive chronic kidney disease with stage 1 through stage 4 chronic kidney disease, or unspecified chronic kidney disease: Secondary | ICD-10-CM | POA: Diagnosis not present

## 2021-04-24 DIAGNOSIS — Z79899 Other long term (current) drug therapy: Secondary | ICD-10-CM | POA: Diagnosis not present

## 2021-04-24 DIAGNOSIS — R1909 Other intra-abdominal and pelvic swelling, mass and lump: Secondary | ICD-10-CM | POA: Diagnosis not present

## 2021-04-24 DIAGNOSIS — G4700 Insomnia, unspecified: Secondary | ICD-10-CM | POA: Diagnosis not present

## 2021-04-24 DIAGNOSIS — E785 Hyperlipidemia, unspecified: Secondary | ICD-10-CM | POA: Diagnosis not present

## 2021-04-24 DIAGNOSIS — Z1211 Encounter for screening for malignant neoplasm of colon: Secondary | ICD-10-CM | POA: Diagnosis not present

## 2021-04-24 DIAGNOSIS — Z125 Encounter for screening for malignant neoplasm of prostate: Secondary | ICD-10-CM | POA: Diagnosis not present

## 2021-04-24 DIAGNOSIS — I10 Essential (primary) hypertension: Secondary | ICD-10-CM | POA: Diagnosis not present

## 2021-04-24 DIAGNOSIS — E1165 Type 2 diabetes mellitus with hyperglycemia: Secondary | ICD-10-CM | POA: Diagnosis not present

## 2021-04-24 DIAGNOSIS — Z0001 Encounter for general adult medical examination with abnormal findings: Secondary | ICD-10-CM | POA: Diagnosis not present

## 2021-04-27 DIAGNOSIS — I10 Essential (primary) hypertension: Secondary | ICD-10-CM | POA: Diagnosis not present

## 2021-04-27 DIAGNOSIS — E785 Hyperlipidemia, unspecified: Secondary | ICD-10-CM | POA: Diagnosis not present

## 2021-04-27 DIAGNOSIS — Z79899 Other long term (current) drug therapy: Secondary | ICD-10-CM | POA: Diagnosis not present

## 2021-04-27 DIAGNOSIS — Z125 Encounter for screening for malignant neoplasm of prostate: Secondary | ICD-10-CM | POA: Diagnosis not present

## 2021-04-27 DIAGNOSIS — E1165 Type 2 diabetes mellitus with hyperglycemia: Secondary | ICD-10-CM | POA: Diagnosis not present

## 2021-05-31 ENCOUNTER — Other Ambulatory Visit: Payer: Self-pay | Admitting: Family Medicine

## 2021-05-31 DIAGNOSIS — R1909 Other intra-abdominal and pelvic swelling, mass and lump: Secondary | ICD-10-CM

## 2021-06-01 ENCOUNTER — Encounter: Payer: Self-pay | Admitting: Family Medicine

## 2021-06-01 ENCOUNTER — Ambulatory Visit: Payer: Medicare PPO | Admitting: Family Medicine

## 2021-06-01 ENCOUNTER — Other Ambulatory Visit: Payer: Self-pay

## 2021-06-01 VITALS — BP 128/74 | HR 81 | Temp 98.0°F | Resp 15 | Ht 72.0 in | Wt 171.6 lb

## 2021-06-01 DIAGNOSIS — Z23 Encounter for immunization: Secondary | ICD-10-CM | POA: Diagnosis not present

## 2021-06-01 DIAGNOSIS — I10 Essential (primary) hypertension: Secondary | ICD-10-CM | POA: Diagnosis not present

## 2021-06-01 DIAGNOSIS — Z1329 Encounter for screening for other suspected endocrine disorder: Secondary | ICD-10-CM | POA: Diagnosis not present

## 2021-06-01 DIAGNOSIS — E785 Hyperlipidemia, unspecified: Secondary | ICD-10-CM

## 2021-06-01 DIAGNOSIS — E1169 Type 2 diabetes mellitus with other specified complication: Secondary | ICD-10-CM | POA: Diagnosis not present

## 2021-06-01 DIAGNOSIS — E049 Nontoxic goiter, unspecified: Secondary | ICD-10-CM | POA: Diagnosis not present

## 2021-06-01 NOTE — Progress Notes (Signed)
Subjective:  Patient ID: Jerry Rangel, male    DOB: 09-Dec-1950  Age: 70 y.o. MRN: [id number]  CC:  Chief Complaint  Patient presents with   New Patient (Initial Visit)    Pt here to establish care      HPI Jerry Rangel presents for   New patient to establish care.  Prior patient of Dr. Shelia Rangel. Looking for a change.  [facility] - R knee surgery in March - tendon rupture after a fall.   Angioedema: I did see him for an acute visitMarch for angioedema.  Was on more metformin 4 days previously, lisinopril HCTZ twice daily for years at that time for his hypertension as well as nifedipine and atenolol.  Most likely due to ACE inhibitor but with recent start of metformin that was also discontinued temporarily.  Nifedipine was increased, HCTZ was continued.  Strict avoidance of ACE inhibitor's in the future discussed. No return of swelling. Not taking any further ACE-I.   Hypertension: No hx of CAD/MI or CVA. No known kidney disease.  Nifedipine XL 76m qd, HCTZ 227mqd, atenolol 2529md.  No new side effects.  Home readings: 130-144/76 BP Readings from Last 3 Encounters:  06/01/21 128/74  02/07/21 (!) 160/83  01/31/21 (!) 155/79   Lab Results  Component Value Date   CREATININE 1.21 10/07/2012    Diabetes: Treated with metformin 1000m50mD.  No known complications. Assoc with HLD On statin. No ACE-I, prior angioedema.  Home readings: Fasting 120-130.  Postprandial 120-156.  No symptomatic lows.  Microalbumin:  Optho -yearly - appt in December - Dr ShapGershon Craneab Results  Component Value Date   HGBA1C 5.9 10/07/2012   Lab Results  Component Value Date   LDLCALC 86 10/07/2012   CREATININE 1.21 10/07/2012   Insomnia Episodic trazodone - once per week. Working well.  Exercising works well to help sleep.   Thyroid disease: Prior biopsy 2015 left nodule- benign, nonneoplastic goiter, ultrasound 08/27/16 stable.  Some cold intolerance. No new hair or skin changes,  heart palpitations or new fatigue. Lost weight earlier this year, now gaining. Unknown if recent testing.    HM: Negative cologuard 09/28/19.  Flu vaccine - today.  Covid vaccine - had primary and 2 boosters - most recent few weeks ago. [facility].  Pneumonia vaccine - at last practice.    History Patient Active Problem List   Diagnosis Date Noted   HTN (hypertension) 10/07/2012   Other and unspecified hyperlipidemia 10/07/2012   ED (erectile dysfunction) 10/07/2012   Past Medical History:  Diagnosis Date   Diabetes mellitus without complication (HCC)Jerry Rangel Erectile dysfunction    Hyperlipidemia    Hypertension    Past Surgical History:  Procedure Laterality Date   KNEE SURGERY Right    Allergies  Allergen Reactions   Ace Inhibitors Swelling   Prior to Admission medications   Medication Sig Start Date End Date Taking? Authorizing Provider  aspirin 81 MG tablet Take 81 mg by mouth daily.   Yes [provider]  atenolol (TENORMIN) 25 MG tablet Take 1 tablet (25 mg total) by mouth daily. PATIENT NEEDS OFFICE VISIT FOR ADDITIONAL REFILLS   Yes Weber, Sarah L, PA-C  hydrochlorothiazide (HYDRODIURIL) 25 MG tablet TAKE 1 TABLET (25 MG TOTAL) BY MOUTH DAILY. 09/29/20  Yes Jerry Rangel  metFORMIN (GLUCOPHAGE) 500 MG tablet Take by mouth 2 (two) times daily with a meal.   Yes [provider]  NIFEdipine (PROCARDIA XL/NIFEDICAL-XL) 90 MG  24 hr tablet TAKE 1 TABLET BY MOUTH EVERY DAY 10/21/20  Yes Jerry Agreste, MD  [facility] VERIO test strip as directed. 09/07/20  Yes [provider]  rosuvastatin (CRESTOR) 10 MG tablet Take 1 tablet (10 mg total) by mouth daily. NEED VISIT, LABS!!   Yes Jerry Mech E, PA-C  traZODone (DESYREL) 50 MG tablet Take 50-100 mg by mouth at bedtime as needed. 12/19/20  Yes [provider]  VIAGRA 100 MG tablet TAKE 1 TABLET PRIOR TO INTERCOURSE AS DIRECTED 08/10/13  Yes Weber, Sarah L, PA-C  tizanidine (ZANAFLEX) 2 MG  capsule Take 1 capsule (2 mg total) by mouth 3 (three) times daily. Patient not taking: Reported on 06/01/2021 02/07/21   Jerry Sams, PA-C   Social History   Socioeconomic History   Marital status: Married    Spouse name: Not on file   Number of children: Not on file   Years of education: Not on file   Highest education level: Not on file  Occupational History   Not on file  Tobacco Use   Smoking status: Never   Smokeless tobacco: Never  Substance and Sexual Activity   Alcohol use: Yes    Alcohol/week: 2.0 standard drinks    Types: 2 Glasses of wine per week   Drug use: No   Sexual activity: Yes    Birth control/protection: None  Other Topics Concern   Not on file  Social History Narrative   Not on file   Social Determinants of Health   Financial Resource Strain: Not on file  Food Insecurity: Not on file  Transportation Needs: Not on file  Physical Activity: Not on file  Stress: Not on file  Social Connections: Not on file  Intimate Partner Violence: Not on file    Review of Systems  Constitutional:  Positive for unexpected weight change (prior weight 182-185, now in 170s, thought to be DM, improving slowly.). Negative for fatigue.  Eyes:  Negative for visual disturbance.  Respiratory:  Negative for cough, chest tightness and shortness of breath.   Cardiovascular:  Negative for chest pain, palpitations and leg swelling.  Gastrointestinal:  Negative for abdominal pain and blood in stool.  Neurological:  Negative for dizziness, light-headedness and headaches.    Objective:   Vitals:   06/01/21 1452  BP: 128/74  Pulse: 81  Resp: 15  Temp: 98 F (36.7 C)  TempSrc: Temporal  SpO2: 98%  Weight: 171 lb 9.6 oz (77.8 kg)  Height: 6' (1.829 m)     Physical Exam Vitals reviewed.  Constitutional:      Appearance: He is well-developed.  HENT:     Head: Normocephalic and atraumatic.  Neck:     Vascular: No carotid bruit or JVD.     Comments: Fullness,  prominent left thyroid greater than right. Nontender.  Cardiovascular:     Rate and Rhythm: Normal rate and regular rhythm.     Heart sounds: Normal heart sounds. No murmur heard. Pulmonary:     Effort: Pulmonary effort is normal.     Breath sounds: Normal breath sounds. No rales.  Musculoskeletal:     Right lower leg: No edema.     Left lower leg: No edema.  Skin:    General: Skin is warm and dry.  Neurological:     Mental Status: He is alert and oriented to person, place, and time.  Psychiatric:        Mood and Affect: Mood normal.  Assessment & Plan:  Jefferson Fullam is a 70 y.o. male . Type 2 diabetes mellitus with hyperlipidemia ([place]) - Plan: Comprehensive metabolic panel, Hemoglobin A1c, Microalbumin / creatinine urine ratio  - updated labs, no med changes. Handout on self care .recehck in 3 months.   Goiter - Plan: TSH Thyroid disorder screening - Plan: TSH  - prior reassuring biopsy, ultrasound stable. Check TSH - question of prior meds. Weight change this year - unsure if tsh had been checked.   Essential hypertension - Plan: Comprehensive metabolic panel  -  Stable, tolerating current regimen. Medications refilled. Labs pending as above - fasting lab only visit.    Hyperlipidemia, unspecified hyperlipidemia type - Plan: Comprehensive metabolic panel, Lipid panel  - tolerating statin. Labs pending. No changes.   Needs flu shot - Plan: Flu vaccine HIGH DOSE PF (Fluzone High dose)   No orders of the defined types were placed in this encounter.  Patient Instructions  Make sure your eye specialist knows you have diabetes and have them send me a report.   Have fasting labs at [facility] in next week. No med changes for now.   Thanks for coming in today.   Type 2 Diabetes Mellitus, Self-Care, Adult Caring for yourself after you have been diagnosed with type 2 diabetes (type 2 diabetes mellitus) means keeping your blood sugar (glucose) under control with a  balance of: Nutrition. Exercise. Lifestyle changes. Medicines or insulin, if needed. Support from your team of health care providers and others. The following information explains what you need to know to manage your diabetes at home. What are the risks? Having diabetes can put you at risk for other long-term (chronic) conditions, such as heart disease and kidney disease. Your health care provider may prescribe medicines to help prevent complications from diabetes. How to monitor blood glucose  Check your blood glucose every day, or as often as told by your health care provider. Have your A1C (hemoglobin A1C) level checked two or more times a year, or as often as told by your health care provider. Your health care provider will set personalized treatment goals for you. Generally, the goal of treatment is to maintain the following blood glucose levels: Before meals: 80-130 mg/dL (4.4-7.2 mmol/L). After meals: below 180 mg/dL (10 mmol/L). A1C level: less than 7%. How to manage hyperglycemia and hypoglycemia Hyperglycemia symptoms Hyperglycemia, also called high blood glucose, occurs when blood glucose is too high. Make sure you know the early signs of hyperglycemia, such as: Increased thirst. Hunger. Feeling very tired. Needing to urinate more often than usual. Blurry vision. Hypoglycemia symptoms Hypoglycemia, also called low blood glucose, occurs with a blood glucose level at or below 70 mg/dL (3.9 mmol/L). Diabetes medicines lower your blood glucose and can cause hypoglycemia. The risk for hypoglycemia increases during or after exercise, during sleep, during illness, and when skipping meals or not eating for a long time (fasting). It is important to know the symptoms of hypoglycemia and treat it right away. Always have a 15-gram rapid-acting carbohydrate snack with you to treat low blood glucose. Family members and close friends should also know the symptoms and understand how to treat  hypoglycemia, in case you are not able to treat yourself. Symptoms may include: Hunger. Anxiety. Sweating and feeling clammy. Dizziness or feeling light-headed. Sleepiness. Increased heart rate. Irritability. Tingling or numbness around the mouth, lips, or tongue. Restless sleep. Severe hypoglycemia is when your blood glucose level is at or below 54 mg/dL (3 mmol/L). Severe  hypoglycemia is an emergency. Do not wait to see if the symptoms will go away. Get medical help right away. Call your local emergency services (911 in the [place] Do not drive yourself to the hospital. If you have severe hypoglycemia and you cannot eat or drink, you may need glucagon. A family member or close friend should learn how to check your blood glucose and how to give you glucagon. Ask your health care provider if you need to have an emergency glucagon kit available. Follow these instructions at home: Medicines Take diabetes medicines as told by your health care provider. If your health care provider prescribed insulin or diabetes medicines, take them every day. Do not run out of insulin or other diabetes medicines. Plan ahead so you always have these available. If you use insulin, adjust your dosage based on your physical activity and what foods you eat. Your health care provider will tell you how to adjust your dosage. Take over-the-counter and prescription medicines only as told by your health care provider. Eating and drinking What you eat and drink affects your blood glucose and your insulin dosage. Making good choices helps to control your diabetes and prevent other health problems. A healthy meal plan includes eating lean proteins, complex carbohydrates, fresh fruits and vegetables, low-fat dairy products, and healthy fats. Make an appointment to see a registered dietitian to help you create an eating plan that is right for you. Make sure that you: Follow instructions from your health care provider about eating  or drinking restrictions. Drink enough fluid to keep your urine pale yellow. Keep a record of the carbohydrates that you eat. Do this by reading food labels and learning the standard serving sizes of foods. Follow your sick-day plan whenever you cannot eat or drink as usual. Make this plan in advance with your health care provider.  Activity Stay active. Exercise regularly, as told by your health care provider. This may include: Stretching and doing strength exercises, such as yoga or weight lifting, 2 or more times a week. Doing 150 minutes or more of moderate-intensity or vigorous-intensity exercise each week. This could be brisk walking, biking, or water aerobics. Spread out your activity over 3 or more days of the week. Do not go more than 2 days in a row without doing some kind of physical activity. When you start a new exercise or activity, work with your health care provider to adjust your insulin, medicines, or food intake as needed. Lifestyle Do not use any products that contain nicotine or tobacco, such as cigarettes, Rangel-cigarettes, and chewing tobacco. If you need help quitting, ask your health care provider. If your health care provider says that alcohol is safe for you, limit how much you use to no more than 1 drink a day for women who are not pregnant and 2 drinks a day for men. In the [place] one drink equals one 12 oz bottle of beer (355 mL), one 5 oz glass of wine (148 mL), or one 1 oz glass of hard liquor (44 mL). Learn to manage stress. If you need help with this, ask your health care provider. Take care of your body  Keep your immunizations up to date. In addition to getting vaccinations as told by your health care provider, it is recommended that you get vaccinated against the following illnesses: The flu (influenza). Get a flu shot every year. Pneumonia. Hepatitis B. Schedule an eye exam soon after your diagnosis, and then one time every year after that. Check  your skin and  feet every day for cuts, bruises, redness, blisters, or sores. Schedule a foot exam with your health care provider once every year. Brush your teeth and gums two times a day, and floss one or more times a day. Visit your dentist one or more times every 6 months. Maintain a healthy weight. General instructions Share your diabetes management plan with people in your workplace, school, and household. Carry a medical alert card or wear medical alert jewelry. Keep all follow-up visits as told by your health care provider. This is important. Questions to ask your health care provider Should I meet with a certified diabetes care and education specialist? Where can I find a support group for people with diabetes? Where to find more information American Diabetes Association (ADA): www.diabetes.org American Association of Diabetes Care and Education Specialists (ADCES): www.diabeteseducator.org International Diabetes Federation (IDF): MemberVerification.ca Summary Caring for yourself after you have been diagnosed with type 2 diabetes (type 2 diabetes mellitus) means keeping your blood sugar (glucose) under control with a balance of nutrition, exercise, lifestyle changes, and medicine. Check your blood glucose every day, as often as told by your health care provider. Having diabetes can put you at risk for other long-term (chronic) conditions, such as heart disease and kidney disease. Your health care provider may prescribe medicines to help prevent complications from diabetes. Share your diabetes management plan with people in your workplace, school, and household. Keep all follow-up visits as told by your health care provider. This is important. This information is not intended to replace advice given to you by your health care provider. Make sure you discuss any questions you have with your health care provider. Document Revised: 06/29/2020 Document Reviewed: 02/17/2020 Elsevier Patient Education  2022 [place],   Merri Ray, MD [place] 06/01/21 4:05 PM

## 2021-06-01 NOTE — Patient Instructions (Addendum)
Make sure your eye specialist knows you have diabetes and have them send me a report.   Have fasting labs at [facility] in next week. No med changes for now.   Thanks for coming in today.   Type 2 Diabetes Mellitus, Self-Care, Adult Caring for yourself after you have been diagnosed with type 2 diabetes (type 2 diabetes mellitus) means keeping your blood sugar (glucose) under control with a balance of: Nutrition. Exercise. Lifestyle changes. Medicines or insulin, if needed. Support from your team of health care providers and others. The following information explains what you need to know to manage your diabetes at home. What are the risks? Having diabetes can put you at risk for other long-term (chronic) conditions, such as heart disease and kidney disease. Your health care provider may prescribe medicines to help prevent complications from diabetes. How to monitor blood glucose  Check your blood glucose every day, or as often as told by your health care provider. Have your A1C (hemoglobin A1C) level checked two or more times a year, or as often as told by your health care provider. Your health care provider will set personalized treatment goals for you. Generally, the goal of treatment is to maintain the following blood glucose levels: Before meals: 80-130 mg/dL (4.4-7.2 mmol/L). After meals: below 180 mg/dL (10 mmol/L). A1C level: less than 7%. How to manage hyperglycemia and hypoglycemia Hyperglycemia symptoms Hyperglycemia, also called high blood glucose, occurs when blood glucose is too high. Make sure you know the early signs of hyperglycemia, such as: Increased thirst. Hunger. Feeling very tired. Needing to urinate more often than usual. Blurry vision. Hypoglycemia symptoms Hypoglycemia, also called low blood glucose, occurs with a blood glucose level at or below 70 mg/dL (3.9 mmol/L). Diabetes medicines lower your blood glucose and can cause hypoglycemia. The risk for  hypoglycemia increases during or after exercise, during sleep, during illness, and when skipping meals or not eating for a long time (fasting). It is important to know the symptoms of hypoglycemia and treat it right away. Always have a 15-gram rapid-acting carbohydrate snack with you to treat low blood glucose. Family members and close friends should also know the symptoms and understand how to treat hypoglycemia, in case you are not able to treat yourself. Symptoms may include: Hunger. Anxiety. Sweating and feeling clammy. Dizziness or feeling light-headed. Sleepiness. Increased heart rate. Irritability. Tingling or numbness around the mouth, lips, or tongue. Restless sleep. Severe hypoglycemia is when your blood glucose level is at or below 54 mg/dL (3 mmol/L). Severe hypoglycemia is an emergency. Do not wait to see if the symptoms will go away. Get medical help right away. Call your local emergency services (911 in the [place] Do not drive yourself to the hospital. If you have severe hypoglycemia and you cannot eat or drink, you may need glucagon. A family member or close friend should learn how to check your blood glucose and how to give you glucagon. Ask your health care provider if you need to have an emergency glucagon kit available. Follow these instructions at home: Medicines Take diabetes medicines as told by your health care provider. If your health care provider prescribed insulin or diabetes medicines, take them every day. Do not run out of insulin or other diabetes medicines. Plan ahead so you always have these available. If you use insulin, adjust your dosage based on your physical activity and what foods you eat. Your health care provider will tell you how to adjust your dosage. Take over-the-counter  and prescription medicines only as told by your health care provider. Eating and drinking What you eat and drink affects your blood glucose and your insulin dosage. Making good choices  helps to control your diabetes and prevent other health problems. A healthy meal plan includes eating lean proteins, complex carbohydrates, fresh fruits and vegetables, low-fat dairy products, and healthy fats. Make an appointment to see a registered dietitian to help you create an eating plan that is right for you. Make sure that you: Follow instructions from your health care provider about eating or drinking restrictions. Drink enough fluid to keep your urine pale yellow. Keep a record of the carbohydrates that you eat. Do this by reading food labels and learning the standard serving sizes of foods. Follow your sick-day plan whenever you cannot eat or drink as usual. Make this plan in advance with your health care provider.  Activity Stay active. Exercise regularly, as told by your health care provider. This may include: Stretching and doing strength exercises, such as yoga or weight lifting, 2 or more times a week. Doing 150 minutes or more of moderate-intensity or vigorous-intensity exercise each week. This could be brisk walking, biking, or water aerobics. Spread out your activity over 3 or more days of the week. Do not go more than 2 days in a row without doing some kind of physical activity. When you start a new exercise or activity, work with your health care provider to adjust your insulin, medicines, or food intake as needed. Lifestyle Do not use any products that contain nicotine or tobacco, such as cigarettes, e-cigarettes, and chewing tobacco. If you need help quitting, ask your health care provider. If your health care provider says that alcohol is safe for you, limit how much you use to no more than 1 drink a day for women who are not pregnant and 2 drinks a day for men. In the [place] one drink equals one 12 oz bottle of beer (355 mL), one 5 oz glass of wine (148 mL), or one 1 oz glass of hard liquor (44 mL). Learn to manage stress. If you need help with this, ask your health care  provider. Take care of your body  Keep your immunizations up to date. In addition to getting vaccinations as told by your health care provider, it is recommended that you get vaccinated against the following illnesses: The flu (influenza). Get a flu shot every year. Pneumonia. Hepatitis B. Schedule an eye exam soon after your diagnosis, and then one time every year after that. Check your skin and feet every day for cuts, bruises, redness, blisters, or sores. Schedule a foot exam with your health care provider once every year. Brush your teeth and gums two times a day, and floss one or more times a day. Visit your dentist one or more times every 6 months. Maintain a healthy weight. General instructions Share your diabetes management plan with people in your workplace, school, and household. Carry a medical alert card or wear medical alert jewelry. Keep all follow-up visits as told by your health care provider. This is important. Questions to ask your health care provider Should I meet with a certified diabetes care and education specialist? Where can I find a support group for people with diabetes? Where to find more information American Diabetes Association (ADA): www.diabetes.org American Association of Diabetes Care and Education Specialists (ADCES): www.diabeteseducator.org International Diabetes Federation (IDF): MemberVerification.ca Summary Caring for yourself after you have been diagnosed with type 2 diabetes (type 2  diabetes mellitus) means keeping your blood sugar (glucose) under control with a balance of nutrition, exercise, lifestyle changes, and medicine. Check your blood glucose every day, as often as told by your health care provider. Having diabetes can put you at risk for other long-term (chronic) conditions, such as heart disease and kidney disease. Your health care provider may prescribe medicines to help prevent complications from diabetes. Share your diabetes management plan with  people in your workplace, school, and household. Keep all follow-up visits as told by your health care provider. This is important. This information is not intended to replace advice given to you by your health care provider. Make sure you discuss any questions you have with your health care provider. Document Revised: 06/29/2020 Document Reviewed: 02/17/2020 Elsevier Patient Education  [place].

## 2021-06-02 LAB — MICROALBUMIN / CREATININE URINE RATIO
Creatinine,U: 84 mg/dL
Microalb Creat Ratio: 3.6 mg/g (ref 0.0–30.0)
Microalb, Ur: 3 mg/dL — ABNORMAL HIGH (ref 0.0–1.9)

## 2021-06-08 ENCOUNTER — Other Ambulatory Visit (INDEPENDENT_AMBULATORY_CARE_PROVIDER_SITE_OTHER): Payer: Medicare PPO

## 2021-06-08 DIAGNOSIS — Z1329 Encounter for screening for other suspected endocrine disorder: Secondary | ICD-10-CM | POA: Diagnosis not present

## 2021-06-08 DIAGNOSIS — E049 Nontoxic goiter, unspecified: Secondary | ICD-10-CM | POA: Diagnosis not present

## 2021-06-08 DIAGNOSIS — E785 Hyperlipidemia, unspecified: Secondary | ICD-10-CM

## 2021-06-08 DIAGNOSIS — E1169 Type 2 diabetes mellitus with other specified complication: Secondary | ICD-10-CM | POA: Diagnosis not present

## 2021-06-08 DIAGNOSIS — I10 Essential (primary) hypertension: Secondary | ICD-10-CM | POA: Diagnosis not present

## 2021-06-08 LAB — COMPREHENSIVE METABOLIC PANEL
ALT: 28 U/L (ref 0–53)
AST: 22 U/L (ref 0–37)
Albumin: 4.9 g/dL (ref 3.5–5.2)
Alkaline Phosphatase: 41 U/L (ref 39–117)
BUN: 16 mg/dL (ref 6–23)
CO2: 26 mEq/L (ref 19–32)
Calcium: 9.9 mg/dL (ref 8.4–10.5)
Chloride: 99 mEq/L (ref 96–112)
Creatinine, Ser: 1.22 mg/dL (ref 0.40–1.50)
GFR: 60.22 mL/min (ref >60.00)
Glucose, Bld: 142 mg/dL — ABNORMAL HIGH (ref 70–99)
Potassium: 3.4 mEq/L — ABNORMAL LOW (ref 3.5–5.1)
Sodium: 137 mEq/L (ref 135–145)
Total Bilirubin: 0.5 mg/dL (ref 0.2–1.2)
Total Protein: 7.7 g/dL (ref 6.0–8.3)

## 2021-06-08 LAB — LIPID PANEL
Cholesterol: 127 mg/dL (ref 0–200)
HDL: 38.3 mg/dL — ABNORMAL LOW (ref >39.00)
LDL Cholesterol: 67 mg/dL (ref 0–99)
NonHDL: 88.85
Total CHOL/HDL Ratio: 3
Triglycerides: 107 mg/dL (ref 0.0–149.0)
VLDL: 21.4 mg/dL (ref 0.0–40.0)

## 2021-06-08 LAB — HEMOGLOBIN A1C: Hgb A1c MFr Bld: 5.6 % (ref 4.6–6.5)

## 2021-06-08 LAB — TSH: TSH: 1.21 u[IU]/mL (ref 0.35–5.50)

## 2021-06-21 NOTE — Progress Notes (Signed)
Patient returned call and is aware of labs

## 2021-06-21 NOTE — Progress Notes (Signed)
Lvm for patient to return call.

## 2021-08-29 DIAGNOSIS — E1136 Type 2 diabetes mellitus with diabetic cataract: Secondary | ICD-10-CM | POA: Diagnosis not present

## 2021-08-29 DIAGNOSIS — Z7984 Long term (current) use of oral hypoglycemic drugs: Secondary | ICD-10-CM | POA: Diagnosis not present

## 2021-08-29 DIAGNOSIS — H5213 Myopia, bilateral: Secondary | ICD-10-CM | POA: Diagnosis not present

## 2021-08-29 DIAGNOSIS — H2513 Age-related nuclear cataract, bilateral: Secondary | ICD-10-CM | POA: Diagnosis not present

## 2021-08-29 DIAGNOSIS — H52203 Unspecified astigmatism, bilateral: Secondary | ICD-10-CM | POA: Diagnosis not present

## 2021-08-29 DIAGNOSIS — H524 Presbyopia: Secondary | ICD-10-CM | POA: Diagnosis not present

## 2021-08-29 LAB — HM DIABETES EYE EXAM

## 2021-09-01 ENCOUNTER — Encounter: Payer: Self-pay | Admitting: Family Medicine

## 2021-09-04 ENCOUNTER — Encounter: Payer: Self-pay | Admitting: Family Medicine

## 2021-09-04 ENCOUNTER — Ambulatory Visit: Payer: Medicare PPO | Admitting: Family Medicine

## 2021-09-04 VITALS — BP 138/76 | HR 90 | Temp 97.8°F | Resp 17 | Ht 72.0 in | Wt 171.6 lb

## 2021-09-04 DIAGNOSIS — E785 Hyperlipidemia, unspecified: Secondary | ICD-10-CM | POA: Diagnosis not present

## 2021-09-04 DIAGNOSIS — E1169 Type 2 diabetes mellitus with other specified complication: Secondary | ICD-10-CM

## 2021-09-04 DIAGNOSIS — I10 Essential (primary) hypertension: Secondary | ICD-10-CM

## 2021-09-04 DIAGNOSIS — Z23 Encounter for immunization: Secondary | ICD-10-CM | POA: Diagnosis not present

## 2021-09-04 NOTE — Progress Notes (Signed)
Subjective:  Patient ID: Jerry Rangel, male    DOB: Dec 10, 1950  Age: 71 y.o. MRN: [id number]  CC:  Chief Complaint  Patient presents with   Hypertension    Pt reports doing well, would like to take less medications for blood pressure did not know if any can be combined, denies physical sxs Pt is only take HCTZ for bp    Diabetes    Pt here for recheck no concerns from pt    Hyperlipidemia    Due for recheck, no concerns at this time    Immunizations    Pt shows due for pneumonia vaccine, is willing to do today if so advised     HPI [name] presents for   Hypertension: Hctz 25mg  per day, atenolol 25mg  qd, nifedipine 90mg  qd, spirinolactone 25mg  qd.  Home [id number] systolic.  No new side effects.  Exercising started at [employer] center recently. Walking for exercise.   BP Readings from Last 3 Encounters:  09/04/21 138/76  06/01/21 128/74  02/07/21 (!) 160/83   Lab Results  Component Value Date   CREATININE 1.22 06/08/2021   Diabetes: Metformin 1000mg  BID Fasting:80-105 No lows.  Microalbumin: elevated 3.0 on 06/01/21. Angioedema with ACE-I.  Optho, foot exam, pneumovax: optho last week. Dr. Gershon Crane.  Pneumonia vaccine today.   Lab Results  Component Value Date   HGBA1C 5.6 06/08/2021   HGBA1C 5.9 10/07/2012   Lab Results  Component Value Date   MICROALBUR 3.0 (H) 06/01/2021   LDLCALC 67 06/08/2021   CREATININE 1.22 06/08/2021   Immunization History  Administered Date(s) Administered   DTaP 12/31/2006   Fluad Quad(high Dose 65+) 06/01/2021   Influenza, Quadrivalent, Recombinant, Inj, Pf 05/15/2018, 05/13/2019   Influenza-Unspecified 04/29/2018   Pneumococcal Conjugate-13 11/07/2017   Hyperlipidemia: Crestor 10mg  qd.. no new myalgias.  Lab Results  Component Value Date   CHOL 127 06/08/2021   HDL 38.30 (L) 06/08/2021   LDLCALC 67 06/08/2021   TRIG 107.0 06/08/2021   CHOLHDL 3 06/08/2021   Lab Results  Component Value Date   ALT  28 06/08/2021   AST 22 06/08/2021   ALKPHOS 41 06/08/2021   BILITOT 0.5 06/08/2021       History Patient Active Problem List   Diagnosis Date Noted   HTN (hypertension) 10/07/2012   Other and unspecified hyperlipidemia 10/07/2012   ED (erectile dysfunction) 10/07/2012   Past Medical History:  Diagnosis Date   Diabetes mellitus without complication ([place])    Erectile dysfunction    Hyperlipidemia    Hypertension    Past Surgical History:  Procedure Laterality Date   KNEE SURGERY Right    Allergies  Allergen Reactions   Ace Inhibitors Swelling   Prior to Admission medications   Medication Sig Start Date End Date Taking? Authorizing Provider  aspirin 81 MG tablet Take 81 mg by mouth daily.   Yes [provider]  atenolol (TENORMIN) 25 MG tablet Take 1 tablet (25 mg total) by mouth daily. PATIENT NEEDS OFFICE VISIT FOR ADDITIONAL REFILLS   Yes Weber, Sarah L, PA-C  hydrochlorothiazide (HYDRODIURIL) 25 MG tablet TAKE 1 TABLET (25 MG TOTAL) BY MOUTH DAILY. 09/29/20  Yes Wendie Agreste, MD  metFORMIN (GLUCOPHAGE) 500 MG tablet Take by mouth 2 (two) times daily with a meal.   Yes [provider]  NIFEdipine (PROCARDIA XL/NIFEDICAL-XL) 90 MG 24 hr tablet TAKE 1 TABLET BY MOUTH EVERY DAY 10/21/20  Yes Wendie Agreste, MD  [facility] VERIO test strip as directed.  09/07/20  Yes [provider]  rosuvastatin (CRESTOR) 10 MG tablet Take 1 tablet (10 mg total) by mouth daily. NEED VISIT, LABS!!   Yes Bailey Mech E, PA-C  traZODone (DESYREL) 50 MG tablet Take 50-100 mg by mouth at bedtime as needed. 12/19/20  Yes [provider]  VIAGRA 100 MG tablet TAKE 1 TABLET PRIOR TO INTERCOURSE AS DIRECTED 08/10/13  Yes Weber, Damaris Hippo, PA-C   Social History   Socioeconomic History   Marital status: Married    Spouse name: Not on file   Number of children: Not on file   Years of education: Not on file   Highest education level: Not on file  Occupational  History   Not on file  Tobacco Use   Smoking status: Never   Smokeless tobacco: Never  Substance and Sexual Activity   Alcohol use: Yes    Alcohol/week: 2.0 standard drinks    Types: 2 Glasses of wine per week   Drug use: No   Sexual activity: Yes    Birth control/protection: None  Other Topics Concern   Not on file  Social History Narrative   Not on file   Social Determinants of Health   Financial Resource Strain: Not on file  Food Insecurity: Not on file  Transportation Needs: Not on file  Physical Activity: Not on file  Stress: Not on file  Social Connections: Not on file  Intimate Partner Violence: Not on file    Review of Systems  Constitutional:  Negative for fatigue and unexpected weight change.  Eyes:  Negative for visual disturbance.  Respiratory:  Negative for cough, chest tightness and shortness of breath.   Cardiovascular:  Negative for chest pain, palpitations and leg swelling.  Gastrointestinal:  Negative for abdominal pain and blood in stool.  Neurological:  Negative for dizziness, light-headedness and headaches.    Objective:   Vitals:   09/04/21 1437  BP: 138/76  Pulse: 90  Resp: 17  Temp: 97.8 F (36.6 C)  TempSrc: Temporal  SpO2: 99%  Weight: 171 lb 9.6 oz (77.8 kg)  Height: 6' (1.829 m)     Physical Exam Vitals reviewed.  Constitutional:      Appearance: He is well-developed.  HENT:     Head: Normocephalic and atraumatic.  Neck:     Vascular: No carotid bruit or JVD.  Cardiovascular:     Rate and Rhythm: Normal rate and regular rhythm.     Heart sounds: Normal heart sounds. No murmur heard. Pulmonary:     Effort: Pulmonary effort is normal.     Breath sounds: Normal breath sounds. No rales.  Musculoskeletal:     Right lower leg: No edema.     Left lower leg: No edema.  Skin:    General: Skin is warm and dry.  Neurological:     Mental Status: He is alert and oriented to person, place, and time.  Psychiatric:        Mood and  Affect: Mood normal.       Assessment & Plan:  Jerry Rangel is a 71 y.o. male . Type 2 diabetes mellitus with hyperlipidemia (HCC)  -Well-controlled.  Commended on Recent exercise.  Option of decreasing metformin dose to 500 mg twice daily, close monitoring of home readings and restart 1000 g dose if elevations.  Recheck A1c with fasting lab work in 3 months.  Hyperlipidemia, unspecified hyperlipidemia type  -Tolerating current statin dose, continue same.  Fasting labs next visit  Essential hypertension  -  Upper end of normal but overall stable and tolerating current regimen.  Continue same.  Need for vaccination for pneumococcus - Plan: Pneumococcal polysaccharide vaccine 23-valent greater than or equal to 2yo subcutaneous/IM   No orders of the defined types were placed in this encounter.  Patient Instructions  As exercising, you have the option to cut back to 500mg  metformin 2 times per day and recheck in 3 months.       Signed,   Merri Ray, MD [place], [employer] [facility] 09/04/21 3:30 PM

## 2021-09-04 NOTE — Patient Instructions (Signed)
As exercising, you have the option to cut back to 500mg  metformin 2 times per day and recheck in 3 months.

## 2021-10-03 DIAGNOSIS — M25561 Pain in right knee: Secondary | ICD-10-CM | POA: Diagnosis not present

## 2021-10-23 ENCOUNTER — Telehealth: Payer: Self-pay | Admitting: Family Medicine

## 2021-10-23 NOTE — Telephone Encounter (Signed)
Left message for patient to call back and schedule Medicare Annual Wellness Visit (AWV) in office.  ° °If not able to come in office, please offer to do virtually or by telephone.  Left office number and my jabber #[phone]. ° °Due for AWVI ° °Please schedule at anytime with Nurse Health Advisor. °  °

## 2021-11-13 ENCOUNTER — Telehealth: Payer: Self-pay

## 2021-11-13 NOTE — Telephone Encounter (Signed)
MEDICATION: atenolol (TENORMIN) 25 MG tablet  ? ?PHARMACY: [id number] - [place], [place] - [address] ? ?Comments: Patient is completely out.  ? ?**Let patient know to contact pharmacy at the end of the day to make sure medication is ready. ** ? ?** Please notify patient to allow 48-72 hours to process** ? ?**Encourage patient to contact the pharmacy for refills or they can request refills through [facility]** ? ? ?

## 2021-11-13 NOTE — Telephone Encounter (Signed)
Patient would like atenolol medication refill. Medication was prescribed by another provider who states he needs an appointment. Patient last BP check was in February at last office visit. ?

## 2021-11-14 MED ORDER — ATENOLOL 25 MG PO TABS: 25.0000 mg | ORAL_TABLET | Freq: Every day | ORAL | 1 refills | Status: DC

## 2021-11-14 NOTE — Addendum Note (Signed)
Addended by: Meredith Staggers R on: 11/14/2021 10:49 AM ? ? Modules accepted: Orders ? ?

## 2021-11-14 NOTE — Telephone Encounter (Signed)
Refilled

## 2021-12-06 ENCOUNTER — Ambulatory Visit: Payer: Medicare PPO | Admitting: Family Medicine

## 2021-12-06 ENCOUNTER — Encounter: Payer: Self-pay | Admitting: Family Medicine

## 2021-12-06 VITALS — BP 128/74 | HR 80 | Temp 97.8°F | Resp 16 | Ht 72.0 in | Wt 175.0 lb

## 2021-12-06 DIAGNOSIS — I10 Essential (primary) hypertension: Secondary | ICD-10-CM

## 2021-12-06 DIAGNOSIS — E1169 Type 2 diabetes mellitus with other specified complication: Secondary | ICD-10-CM

## 2021-12-06 DIAGNOSIS — E785 Hyperlipidemia, unspecified: Secondary | ICD-10-CM

## 2021-12-06 LAB — COMPREHENSIVE METABOLIC PANEL
ALT: 27 U/L (ref 0–53)
AST: 21 U/L (ref 0–37)
Albumin: 4.5 g/dL (ref 3.5–5.2)
Alkaline Phosphatase: 47 U/L (ref 39–117)
BUN: 18 mg/dL (ref 6–23)
CO2: 27 mEq/L (ref 19–32)
Calcium: 9.9 mg/dL (ref 8.4–10.5)
Chloride: 102 mEq/L (ref 96–112)
Creatinine, Ser: 1.11 mg/dL (ref 0.40–1.50)
GFR: 67.22 mL/min (ref >60.00)
Glucose, Bld: 137 mg/dL — ABNORMAL HIGH (ref 70–99)
Potassium: 4 mEq/L (ref 3.5–5.1)
Sodium: 138 mEq/L (ref 135–145)
Total Bilirubin: 0.5 mg/dL (ref 0.2–1.2)
Total Protein: 7.4 g/dL (ref 6.0–8.3)

## 2021-12-06 LAB — LIPID PANEL
Cholesterol: 124 mg/dL (ref 0–200)
HDL: 36.2 mg/dL — ABNORMAL LOW (ref >39.00)
LDL Cholesterol: 64 mg/dL (ref 0–99)
NonHDL: 87.76
Total CHOL/HDL Ratio: 3
Triglycerides: 117 mg/dL (ref 0.0–149.0)
VLDL: 23.4 mg/dL (ref 0.0–40.0)

## 2021-12-06 LAB — HEMOGLOBIN A1C: Hgb A1c MFr Bld: 6 % (ref 4.6–6.5)

## 2021-12-06 MED ORDER — ATENOLOL 50 MG PO TABS: 50.0000 mg | ORAL_TABLET | Freq: Every day | ORAL | 1 refills | Status: DC

## 2021-12-06 MED ORDER — HYDROCHLOROTHIAZIDE 25 MG PO TABS: 25.0000 mg | ORAL_TABLET | Freq: Every day | ORAL | 1 refills | Status: DC

## 2021-12-06 MED ORDER — METFORMIN HCL 500 MG PO TABS: 500.0000 mg | ORAL_TABLET | Freq: Two times a day (BID) | ORAL | 1 refills | Status: DC

## 2021-12-06 MED ORDER — NIFEDIPINE ER OSMOTIC RELEASE 90 MG PO TB24: 90.0000 mg | ORAL_TABLET | Freq: Every day | ORAL | 1 refills | Status: DC

## 2021-12-06 MED ORDER — ROSUVASTATIN CALCIUM 10 MG PO TABS: 10.0000 mg | ORAL_TABLET | Freq: Every day | ORAL | 1 refills | Status: DC

## 2021-12-06 NOTE — Patient Instructions (Signed)
Keep up the good work.  I will check labs but I anticipate everything will be stable.  No med changes for now.  60-month follow-up for physical.  Let me know if there are questions in the meantime.  Take care! ?

## 2021-12-06 NOTE — Progress Notes (Signed)
? ?Subjective:  ?Patient ID: Jerry Rangel, male    DOB: 1951-05-02  Age: 71 y.o. MRN: [id number] ? ?CC:  ?Chief Complaint  ?Patient presents with  ? Diabetes  ?  Pt here for recheck, no concerns, doing well   ? Hyperlipidemia  ?  Due for recheck pt is fasting this morning   ? Hypertension  ?  Pt reports atenolol was to be 50mg  but was sent in 25mg  notes this needs to be corrected for next refill   ? ? ?HPI ?Jerry Rangel presents for  ? ?Hypertension: ?HCTZ 25 mg daily, nifedipine 90 mg daily, Atenolol, 25 mg - he reports he has been taking 50 mg (2 of the 25mg ).  ?Home readings: 120-130/70's. ?No side effects with meds. Feeling good.  ?BP Readings from Last 3 Encounters:  ?12/06/21 128/74  ?09/04/21 138/76  ?06/01/21 128/74  ? ?Lab Results  ?Component Value Date  ? CREATININE 1.22 06/08/2021  ? ?Hyperlipidemia: ?Crestor 10 mg daily, no new myalgias.  ?Lab Results  ?Component Value Date  ? CHOL 127 06/08/2021  ? HDL 38.30 (L) 06/08/2021  ? Jerry Rangel 06/08/2021  ? TRIG 107.0 06/08/2021  ? CHOLHDL 3 06/08/2021  ? ?Lab Results  ?Component Value Date  ? ALT 28 06/08/2021  ? AST 22 06/08/2021  ? ALKPHOS 41 06/08/2021  ? BILITOT 0.5 06/08/2021  ? ? ?Diabetes: ?With microalbuminuria.  Elevated at 3.0 on 06/01/2021.  Not on ACE inhibitor as history of angioedema.  Treated with metformin 1000 mg twice daily at last visit, well controlled, option of lower dose of 500 mg twice daily. ?Taking metformin 500mg  BID ?Home readings stable: ?Fasting: 102 ?Postprandial: 115-120 ?No sx lows.  ?Optho, foot exam, pneumovax: Up-to-date ?Exercise class once day per week. Walking 30 min per day.  ? ?Lab Results  ?Component Value Date  ? HGBA1C 5.6 06/08/2021  ? HGBA1C 5.9 10/07/2012  ? ?Lab Results  ?Component Value Date  ? MICROALBUR 3.0 (H) 06/01/2021  ? Jerry Rangel 06/08/2021  ? CREATININE 1.22 06/08/2021  ? ? ?History ?Patient Active Problem List  ? Diagnosis Date Noted  ? HTN (hypertension) 10/07/2012  ? Other and unspecified  hyperlipidemia 10/07/2012  ? ED (erectile dysfunction) 10/07/2012  ? ?Past Medical History:  ?Diagnosis Date  ? Diabetes mellitus without complication ([place])   ? Erectile dysfunction   ? Hyperlipidemia   ? Hypertension   ? ?Past Surgical History:  ?Procedure Laterality Date  ? KNEE SURGERY Right   ? ?Allergies  ?Allergen Reactions  ? Ace Inhibitors Swelling  ? ?Prior to Admission medications   ?Medication Sig Start Date End Date Taking? Authorizing Provider  ?aspirin 81 MG tablet Take 81 mg by mouth daily.   Yes [provider]  ?atenolol (TENORMIN) 25 MG tablet Take 1 tablet (25 mg total) by mouth daily. ?Patient taking differently: Take 50 mg by mouth daily. 11/14/21  Yes Jerry Agreste, MD  ?hydrochlorothiazide (HYDRODIURIL) 25 MG tablet TAKE 1 TABLET (25 MG TOTAL) BY MOUTH DAILY. 09/29/20  Yes Jerry Agreste, MD  ?metFORMIN (GLUCOPHAGE) 500 MG tablet Take by mouth 2 (two) times daily with a meal.   Yes [provider]  ?NIFEdipine (PROCARDIA XL/NIFEDICAL-XL) 90 MG 24 hr tablet TAKE 1 TABLET BY MOUTH EVERY DAY 10/21/20  Yes Jerry Agreste, MD  ?Jerry Rangel VERIO test strip as directed. 09/07/20  Yes [provider]  ?rosuvastatin (CRESTOR) 10 MG tablet Take 1 tablet (10 mg total) by mouth daily. NEED VISIT, LABS!!  Yes Jerry Mech E, PA-C  ?traZODone (DESYREL) 50 MG tablet Take 50-100 mg by mouth at bedtime as needed. 12/19/20  Yes [provider]  ?VIAGRA 100 MG tablet TAKE 1 TABLET PRIOR TO INTERCOURSE AS DIRECTED 08/10/13  Yes Weber, Damaris Hippo, PA-C  ? ?Social History  ? ?Socioeconomic History  ? Marital status: Married  ?  Spouse name: Not on file  ? Number of children: Not on file  ? Years of education: Not on file  ? Highest education level: Not on file  ?Occupational History  ? Not on file  ?Tobacco Use  ? Smoking status: Never  ? Smokeless tobacco: Never  ?Substance and Sexual Activity  ? Alcohol use: Yes  ?  Alcohol/week: 2.0 standard drinks  ?  Types: 2 Glasses of wine  per week  ? Drug use: No  ? Sexual activity: Yes  ?  Birth control/protection: None  ?Other Topics Concern  ? Not on file  ?Social History Narrative  ? Not on file  ? ?Social Determinants of Health  ? ?Financial Resource Strain: Not on file  ?Food Insecurity: Not on file  ?Transportation Needs: Not on file  ?Physical Activity: Not on file  ?Stress: Not on file  ?Social Connections: Not on file  ?Intimate Partner Violence: Not on file  ? ? ?Review of Systems  ?Constitutional:  Negative for fatigue and unexpected weight change.  ?Eyes:  Negative for visual disturbance.  ?Respiratory:  Negative for cough, chest tightness and shortness of breath.   ?Cardiovascular:  Negative for chest pain, palpitations and leg swelling.  ?Gastrointestinal:  Negative for abdominal pain and blood in stool.  ?Neurological:  Negative for dizziness, light-headedness and headaches.  ? ? ?Objective:  ? ?Vitals:  ? 12/06/21 0904  ?BP: 128/74  ?Pulse: 80  ?Resp: 16  ?Temp: 97.8 ?F (36.6 ?C)  ?TempSrc: Temporal  ?SpO2: 99%  ?Weight: 175 lb (79.4 kg)  ?Height: 6' (1.829 m)  ? ? ? ?Physical Exam ?Vitals reviewed.  ?Constitutional:   ?   Appearance: He is well-developed.  ?HENT:  ?   Head: Normocephalic and atraumatic.  ?Neck:  ?   Vascular: No carotid bruit or JVD.  ?Cardiovascular:  ?   Rate and Rhythm: Normal rate and regular rhythm.  ?   Heart sounds: Normal heart sounds. No murmur heard. ?Pulmonary:  ?   Effort: Pulmonary effort is normal.  ?   Breath sounds: Normal breath sounds. No rales.  ?Musculoskeletal:  ?   Right lower leg: No edema.  ?   Left lower leg: No edema.  ?Skin: ?   General: Skin is warm and dry.  ?Neurological:  ?   Mental Status: He is alert and oriented to person, place, and time.  ?Psychiatric:     ?   Mood and Affect: Mood normal.  ? ? ? ?Assessment & Plan:  ?Jerry Rangel is a 71 y.o. male . ?Type 2 diabetes mellitus with hyperlipidemia ([place]) - Plan: Comprehensive metabolic panel, Hemoglobin A1c, metFORMIN (GLUCOPHAGE)  500 MG tablet ? -Change in dose last visit, reports controlled readings at home at this lower dose.  Commended on exercise.  Check updated A1c, continue metformin 500 mg twice daily. ? ?Essential hypertension - Plan: Comprehensive metabolic panel, atenolol (TENORMIN) 50 MG tablet, NIFEdipine (PROCARDIA XL/NIFEDICAL-XL) 90 MG 24 hr tablet, hydrochlorothiazide (HYDRODIURIL) 25 MG tablet ? -Stable on current regimen, did adjust the dose of his atenolol to reflect what he has been taking.  Check labs.  Recheck  6 months for physical ? ?Hyperlipidemia, unspecified hyperlipidemia type - Plan: Comprehensive metabolic panel, Lipid panel, rosuvastatin (CRESTOR) 10 MG tablet ? -  Stable, tolerating current regimen. Medications refilled. Labs pending as above.  ? ?Meds ordered this encounter  ?Medications  ? atenolol (TENORMIN) 50 MG tablet  ?  Sig: Take 1 tablet (50 mg total) by mouth daily.  ?  Dispense:  90 tablet  ?  Refill:  1  ? NIFEdipine (PROCARDIA XL/NIFEDICAL-XL) 90 MG 24 hr tablet  ?  Sig: Take 1 tablet (90 mg total) by mouth daily.  ?  Dispense:  90 tablet  ?  Refill:  1  ? metFORMIN (GLUCOPHAGE) 500 MG tablet  ?  Sig: Take 1 tablet (500 mg total) by mouth 2 (two) times daily with a meal.  ?  Dispense:  180 tablet  ?  Refill:  1  ? hydrochlorothiazide (HYDRODIURIL) 25 MG tablet  ?  Sig: Take 1 tablet (25 mg total) by mouth daily.  ?  Dispense:  90 tablet  ?  Refill:  1  ? rosuvastatin (CRESTOR) 10 MG tablet  ?  Sig: Take 1 tablet (10 mg total) by mouth daily.  ?  Dispense:  90 tablet  ?  Refill:  1  ? ?Patient Instructions  ?Keep up the good work.  I will check labs but I anticipate everything will be stable.  No med changes for now.  87-month follow-up for physical.  Let me know if there are questions in the meantime.  Take care! ? ? ? ?Signed,  ? ?Merri Ray, MD ?[place], [facility] ?[facility] ?12/06/21 ?9:46 AM ? ? ?

## 2021-12-07 ENCOUNTER — Telehealth: Payer: Self-pay

## 2021-12-07 NOTE — Telephone Encounter (Signed)
Patient states he forgot to mention to Dr.Greene that he is currently taking spironolactone 25 mg, one tablet once daily.  ?

## 2021-12-08 NOTE — Telephone Encounter (Signed)
Just FYI ? ?This has been added to his list.  ?

## 2022-01-08 ENCOUNTER — Other Ambulatory Visit: Payer: Self-pay

## 2022-01-08 MED ORDER — SPIRONOLACTONE 25 MG PO TABS: 25.0000 mg | ORAL_TABLET | Freq: Every day | ORAL | 1 refills | Status: DC

## 2022-02-15 ENCOUNTER — Telehealth: Payer: Self-pay | Admitting: Family Medicine

## 2022-02-15 NOTE — Telephone Encounter (Signed)
Left message for patient to call back and schedule Medicare Annual Wellness Visit (AWV).   Please offer to do virtually or by telephone.  Left office number and my jabber [phone].  Last AWV:09/27/2017  Please schedule at anytime with Nurse Health Advisor.

## 2022-03-05 DIAGNOSIS — N529 Male erectile dysfunction, unspecified: Secondary | ICD-10-CM | POA: Diagnosis not present

## 2022-03-05 DIAGNOSIS — Z7982 Long term (current) use of aspirin: Secondary | ICD-10-CM | POA: Diagnosis not present

## 2022-03-05 DIAGNOSIS — Z7984 Long term (current) use of oral hypoglycemic drugs: Secondary | ICD-10-CM | POA: Diagnosis not present

## 2022-03-05 DIAGNOSIS — Z811 Family history of alcohol abuse and dependence: Secondary | ICD-10-CM | POA: Diagnosis not present

## 2022-03-05 DIAGNOSIS — I10 Essential (primary) hypertension: Secondary | ICD-10-CM | POA: Diagnosis not present

## 2022-03-05 DIAGNOSIS — R32 Unspecified urinary incontinence: Secondary | ICD-10-CM | POA: Diagnosis not present

## 2022-03-05 DIAGNOSIS — E785 Hyperlipidemia, unspecified: Secondary | ICD-10-CM | POA: Diagnosis not present

## 2022-03-05 DIAGNOSIS — Z809 Family history of malignant neoplasm, unspecified: Secondary | ICD-10-CM | POA: Diagnosis not present

## 2022-03-05 DIAGNOSIS — E119 Type 2 diabetes mellitus without complications: Secondary | ICD-10-CM | POA: Diagnosis not present

## 2022-05-09 ENCOUNTER — Other Ambulatory Visit: Payer: Self-pay | Admitting: Family Medicine

## 2022-05-09 DIAGNOSIS — I10 Essential (primary) hypertension: Secondary | ICD-10-CM

## 2022-06-07 ENCOUNTER — Other Ambulatory Visit: Payer: Self-pay | Admitting: Family Medicine

## 2022-06-07 DIAGNOSIS — E1169 Type 2 diabetes mellitus with other specified complication: Secondary | ICD-10-CM

## 2022-06-14 ENCOUNTER — Ambulatory Visit (INDEPENDENT_AMBULATORY_CARE_PROVIDER_SITE_OTHER): Payer: Medicare PPO | Admitting: Family Medicine

## 2022-06-14 ENCOUNTER — Encounter: Payer: Self-pay | Admitting: Family Medicine

## 2022-06-14 VITALS — BP 130/78 | HR 69 | Temp 98.5°F | Ht 72.0 in | Wt 181.4 lb

## 2022-06-14 DIAGNOSIS — Z23 Encounter for immunization: Secondary | ICD-10-CM | POA: Diagnosis not present

## 2022-06-14 DIAGNOSIS — N529 Male erectile dysfunction, unspecified: Secondary | ICD-10-CM | POA: Diagnosis not present

## 2022-06-14 DIAGNOSIS — Z1159 Encounter for screening for other viral diseases: Secondary | ICD-10-CM

## 2022-06-14 DIAGNOSIS — E785 Hyperlipidemia, unspecified: Secondary | ICD-10-CM | POA: Diagnosis not present

## 2022-06-14 DIAGNOSIS — Z0000 Encounter for general adult medical examination without abnormal findings: Secondary | ICD-10-CM | POA: Diagnosis not present

## 2022-06-14 DIAGNOSIS — E1169 Type 2 diabetes mellitus with other specified complication: Secondary | ICD-10-CM | POA: Diagnosis not present

## 2022-06-14 DIAGNOSIS — I10 Essential (primary) hypertension: Secondary | ICD-10-CM | POA: Diagnosis not present

## 2022-06-14 DIAGNOSIS — R350 Frequency of micturition: Secondary | ICD-10-CM | POA: Diagnosis not present

## 2022-06-14 LAB — POCT URINALYSIS DIP (MANUAL ENTRY)
Bilirubin, UA: NEGATIVE
Blood, UA: NEGATIVE
Glucose, UA: NEGATIVE mg/dL
Leukocytes, UA: NEGATIVE
Nitrite, UA: NEGATIVE
Protein Ur, POC: NEGATIVE mg/dL
Spec Grav, UA: 1.025 (ref 1.010–1.025)
Urobilinogen, UA: 0.2 E.U./dL
pH, UA: 5 (ref 5.0–8.0)

## 2022-06-14 LAB — LIPID PANEL
Cholesterol: 131 mg/dL (ref 0–200)
HDL: 35.7 mg/dL — ABNORMAL LOW (ref >39.00)
LDL Cholesterol: 66 mg/dL (ref 0–99)
NonHDL: 95.7
Total CHOL/HDL Ratio: 4
Triglycerides: 148 mg/dL (ref 0.0–149.0)
VLDL: 29.6 mg/dL (ref 0.0–40.0)

## 2022-06-14 LAB — MICROALBUMIN / CREATININE URINE RATIO
Creatinine,U: 110.4 mg/dL
Microalb Creat Ratio: 1.5 mg/g (ref 0.0–30.0)
Microalb, Ur: 1.7 mg/dL (ref 0.0–1.9)

## 2022-06-14 LAB — COMPREHENSIVE METABOLIC PANEL
ALT: 41 U/L (ref 0–53)
AST: 33 U/L (ref 0–37)
Albumin: 4.8 g/dL (ref 3.5–5.2)
Alkaline Phosphatase: 48 U/L (ref 39–117)
BUN: 17 mg/dL (ref 6–23)
CO2: 30 mEq/L (ref 19–32)
Calcium: 10.3 mg/dL (ref 8.4–10.5)
Chloride: 100 mEq/L (ref 96–112)
Creatinine, Ser: 1.29 mg/dL (ref 0.40–1.50)
GFR: 55.92 mL/min — ABNORMAL LOW (ref >60.00)
Glucose, Bld: 144 mg/dL — ABNORMAL HIGH (ref 70–99)
Potassium: 4.4 mEq/L (ref 3.5–5.1)
Sodium: 137 mEq/L (ref 135–145)
Total Bilirubin: 0.5 mg/dL (ref 0.2–1.2)
Total Protein: 7.4 g/dL (ref 6.0–8.3)

## 2022-06-14 LAB — PSA: PSA: 1 ng/mL (ref 0.10–4.00)

## 2022-06-14 LAB — HEMOGLOBIN A1C: Hgb A1c MFr Bld: 6.6 % — ABNORMAL HIGH (ref 4.6–6.5)

## 2022-06-14 MED ORDER — ATENOLOL 50 MG PO TABS: 50.0000 mg | ORAL_TABLET | Freq: Every day | ORAL | 1 refills | Status: DC

## 2022-06-14 MED ORDER — METFORMIN HCL 500 MG PO TABS: 500.0000 mg | ORAL_TABLET | Freq: Two times a day (BID) | ORAL | 1 refills | Status: DC

## 2022-06-14 MED ORDER — NIFEDIPINE ER OSMOTIC RELEASE 90 MG PO TB24: 90.0000 mg | ORAL_TABLET | Freq: Every day | ORAL | 1 refills | Status: DC

## 2022-06-14 MED ORDER — HYDROCHLOROTHIAZIDE 25 MG PO TABS: 25.0000 mg | ORAL_TABLET | Freq: Every day | ORAL | 1 refills | Status: DC

## 2022-06-14 MED ORDER — ROSUVASTATIN CALCIUM 10 MG PO TABS: 10.0000 mg | ORAL_TABLET | Freq: Every day | ORAL | 1 refills | Status: DC

## 2022-06-14 MED ORDER — SPIRONOLACTONE 25 MG PO TABS: 25.0000 mg | ORAL_TABLET | Freq: Every day | ORAL | 1 refills | Status: DC

## 2022-06-14 NOTE — Progress Notes (Signed)
Subjective:  Patient ID: Jerry Rangel, male    DOB: [birth year]  Age: 71 y.o. MRN: [id number]  CC:  Chief Complaint  Patient presents with   Annual Exam    Pt states he is going to the bathroom to regular now     HPI Jerry Rangel presents for Annual Exam  Diabetes: With microalbuminuria.  Treated with metformin, no ACE inhibitor due to history of angioedema.  500 mg twice daily metformin as well controlled previously.  Walking for exercise. Microalbumin: 3.0 with normal ratio on 06/01/2021.  Repeat today. Optho, foot exam, pneumovax: Up-to-date. Some increased urination during the day past few months, no n/v/abd pain/blurry vision.  Difficulty with erections - viagra not working as well as prior.  Nocturia - 2-3per night.  Trazodone once per week helps sleep.   Lab Results  Component Value Date   HGBA1C 6.0 12/06/2021   HGBA1C 5.6 06/08/2021   HGBA1C 5.9 10/07/2012   Lab Results  Component Value Date   MICROALBUR 3.0 (H) 06/01/2021   LDLCALC 64 12/06/2021   CREATININE 1.11 12/06/2021   Hypertension: Treated with HCTZ, nifedipine, atenolol, spirinolactone.  Home readings: BP Readings from Last 3 Encounters:  06/14/22 130/78  12/06/21 128/74  09/04/21 138/76   Lab Results  Component Value Date   CREATININE 1.11 12/06/2021   Hyperlipidemia: Crestor 10 mg daily.  Lab Results  Component Value Date   CHOL 124 12/06/2021   HDL 36.20 (L) 12/06/2021   LDLCALC 64 12/06/2021   TRIG 117.0 12/06/2021   CHOLHDL 3 12/06/2021   Lab Results  Component Value Date   ALT 27 12/06/2021   AST 21 12/06/2021   ALKPHOS 47 12/06/2021   BILITOT 0.5 12/06/2021        06/14/2022    8:51 AM 12/06/2021    9:06 AM 09/04/2021    2:40 PM 06/01/2021    2:49 PM 09/28/2020   11:28 AM  Depression screen PHQ 2/9  Decreased Interest 0 0 0 0 0  Down, Depressed, Hopeless 0 0 0 0 0  PHQ - 2 Score 0 0 0 0 0  Altered sleeping 3      Tired, decreased energy 0      Change in appetite 0       Feeling bad or failure about yourself  0      Trouble concentrating 0      Moving slowly or fidgety/restless 0      Suicidal thoughts 0      PHQ-9 Score 3        Health Maintenance  Topic Date Due   Hepatitis C Screening  Never done   TETANUS/TDAP  Never done   Medicare Annual Wellness (AWV)  10/12/2017   INFLUENZA VACCINE  02/27/2022   Diabetic kidney evaluation - Urine ACR  06/01/2022   COVID-19 Vaccine (1) 06/30/2022 (Originally 10/14/1951)   Zoster Vaccines- Shingrix (1 of 2) 09/14/2022 (Originally 04/15/2001)   Diabetic kidney evaluation - GFR measurement  12/07/2022   COLONOSCOPY (Pts 45-1yrs Insurance coverage will need to be confirmed)  06/09/2029   Pneumonia Vaccine 79+ Years old  Completed   HPV VACCINES  Aged Out  Cologuard neg 2021.  Prostate: does not have family history of prostate cancer The natural history of prostate cancer and ongoing controversy regarding screening and potential treatment outcomes of prostate cancer has been discussed with the patient. The meaning of a false positive PSA and a false negative PSA has been discussed. He indicates understanding of the  limitations of this screening test and wishes to proceed with screening PSA testing.  Lab Results  Component Value Date   PSA 0.79 10/07/2012    Immunization History  Administered Date(s) Administered   DTaP 12/31/2006   Fluad Quad(high Dose 65+) 06/01/2021   Influenza, Quadrivalent, Recombinant, Inj, Pf 05/15/2018, 05/13/2019   Influenza-Unspecified 04/29/2018   Pneumococcal Conjugate-13 11/07/2017   Pneumococcal Polysaccharide-23 09/04/2021  Flu vaccine today.  Td discussed at pharmacy.  Covid booster recommended.  Option of RSV vaccine discussed at pharmacy Agrees to hep c test.   No results found. Optho exam end of December.   Dental: every 6 months.   Alcohol: 2 per week.   Tobacco: none.   Exercise: walking - 3-4 days per week. 53min+, doing better after knee surgery.    History Patient Active Problem List   Diagnosis Date Noted   HTN (hypertension) 10/07/2012   Other and unspecified hyperlipidemia 10/07/2012   ED (erectile dysfunction) 10/07/2012   Past Medical History:  Diagnosis Date   Diabetes mellitus without complication (HCC)    Erectile dysfunction    Hyperlipidemia    Hypertension    Past Surgical History:  Procedure Laterality Date   KNEE SURGERY Right    Allergies  Allergen Reactions   Ace Inhibitors Swelling   Prior to Admission medications   Medication Sig Start Date End Date Taking? Authorizing Provider  aspirin 81 MG tablet Take 81 mg by mouth daily.   Yes [provider]  atenolol (TENORMIN) 50 MG tablet Take 1 tablet (50 mg total) by mouth daily. 12/06/21  Yes Shade Flood, MD  hydrochlorothiazide (HYDRODIURIL) 25 MG tablet TAKE 1 TABLET (25 MG TOTAL) BY MOUTH DAILY. 05/09/22  Yes Shade Flood, MD  metFORMIN (GLUCOPHAGE) 500 MG tablet TAKE 1 TABLET BY MOUTH 2 TIMES DAILY WITH A MEAL. 06/07/22  Yes Shade Flood, MD  NIFEdipine (PROCARDIA XL/NIFEDICAL-XL) 90 MG 24 hr tablet Take 1 tablet (90 mg total) by mouth daily. 12/06/21  Yes Shade Flood, MD  [facility] VERIO test strip as directed. 09/07/20  Yes [provider]  rosuvastatin (CRESTOR) 10 MG tablet Take 1 tablet (10 mg total) by mouth daily. 12/06/21  Yes Shade Flood, MD  spironolactone (ALDACTONE) 25 MG tablet Take 1 tablet (25 mg total) by mouth daily. 01/08/22  Yes Shade Flood, MD  traZODone (DESYREL) 50 MG tablet Take 50-100 mg by mouth at bedtime as needed. 12/19/20  Yes [provider]  VIAGRA 100 MG tablet TAKE 1 TABLET PRIOR TO INTERCOURSE AS DIRECTED 08/10/13  Yes Weber, Sarah L, PA-C  atenolol (TENORMIN) 25 MG tablet TAKE 1 TABLET (25 MG TOTAL) BY MOUTH DAILY. Patient not taking: Reported on 06/14/2022 05/09/22   Shade Flood, MD   Social History   Socioeconomic History   Marital status: Married    Spouse  name: Not on file   Number of children: Not on file   Years of education: Not on file   Highest education level: Not on file  Occupational History   Not on file  Tobacco Use   Smoking status: Never   Smokeless tobacco: Never  Substance and Sexual Activity   Alcohol use: Yes    Alcohol/week: 2.0 standard drinks of alcohol    Types: 2 Glasses of wine per week   Drug use: No   Sexual activity: Yes    Birth control/protection: None  Other Topics Concern   Not on file  Social History Narrative  Not on file   Social Determinants of Health   Financial Resource Strain: Not on file  Food Insecurity: Not on file  Transportation Needs: Not on file  Physical Activity: Not on file  Stress: Not on file  Social Connections: Not on file  Intimate Partner Violence: Not on file    Review of Systems 13 point review of systems per patient health survey noted.  Negative other than as indicated above or in HPI.    Objective:   Vitals:   06/14/22 0854  BP: 130/78  Pulse: 69  Temp: 98.5 F (36.9 C)  SpO2: 99%  Weight: 181 lb 6.4 oz (82.3 kg)  Height: 6' (1.829 m)     Physical Exam Vitals reviewed.  Constitutional:      Appearance: He is well-developed.  HENT:     Head: Normocephalic and atraumatic.     Right Ear: External ear normal.     Left Ear: External ear normal.  Eyes:     Conjunctiva/sclera: Conjunctivae normal.     Pupils: Pupils are equal, round, and reactive to light.  Neck:     Thyroid: No thyromegaly.  Cardiovascular:     Rate and Rhythm: Normal rate and regular rhythm.     Heart sounds: Normal heart sounds.  Pulmonary:     Effort: Pulmonary effort is normal. No respiratory distress.     Breath sounds: Normal breath sounds. No wheezing.  Abdominal:     General: There is no distension.     Palpations: Abdomen is soft.     Tenderness: There is no abdominal tenderness.  Musculoskeletal:        General: No tenderness. Normal range of motion.     Cervical  back: Normal range of motion and neck supple.  Lymphadenopathy:     Cervical: No cervical adenopathy.  Skin:    General: Skin is warm and dry.  Neurological:     Mental Status: He is alert and oriented to person, place, and time.     Deep Tendon Reflexes: Reflexes are normal and symmetric.  Psychiatric:        Behavior: Behavior normal.    Results for orders placed or performed in visit on 06/14/22  POCT urinalysis dipstick  Result Value Ref Range   Color, UA light yellow (A) yellow   Clarity, UA clear clear   Glucose, UA negative negative mg/dL   Bilirubin, UA negative negative   Ketones, POC UA trace (5) (A) negative mg/dL   Spec Grav, UA 2.440 1.027 - 1.025   Blood, UA negative negative   pH, UA 5.0 5.0 - 8.0   Protein Ur, POC negative negative mg/dL   Urobilinogen, UA 0.2 0.2 or 1.0 E.U./dL   Nitrite, UA Negative Negative   Leukocytes, UA Negative Negative     Assessment & Plan:  Jerry Rangel is a 71 y.o. male . Annual physical exam - -anticipatory guidance as below in AVS, screening labs above. Health maintenance items as above in HPI discussed/recommended as applicable.   Need for influenza vaccination - Plan: Flu Vaccine QUAD High Dose(Fluad)  Hyperlipidemia, unspecified hyperlipidemia type - Plan: rosuvastatin (CRESTOR) 10 MG tablet, Lipid panel  -  Stable, tolerating current regimen. Medications refilled. Labs pending as above.   Erectile dysfunction, unspecified erectile dysfunction type - Plan: Ambulatory referral to Urology  -Incomplete control even on max dose Viagra, refer to urology to discuss other treatment options.  Urinary frequency - Plan: POCT urinalysis dipstick, PSA, Ambulatory referral to Urology  -  Check A1c, urinalysis, PSA.  Referral to urology.  RTC precautions, handout given on urinary frequency.  Triggers discussed.  Reassuring exam.  Type 2 diabetes mellitus with hyperlipidemia (HCC) - Plan: Microalbumin / creatinine urine ratio,  Hemoglobin A1c, Comprehensive metabolic panel, metFORMIN (GLUCOPHAGE) 500 MG tablet  -Stable prior.  Repeat A1c, continue same regimen.  Essential hypertension - Plan: spironolactone (ALDACTONE) 25 MG tablet, Comprehensive metabolic panel, hydrochlorothiazide (HYDRODIURIL) 25 MG tablet, atenolol (TENORMIN) 50 MG tablet, NIFEdipine (PROCARDIA XL/NIFEDICAL-XL) 90 MG 24 hr tablet  -  Stable, tolerating current regimen. Medications refilled. Labs pending as above.   Encounter for hepatitis C screening test for low risk patient - Plan: Hepatitis C antibody   Meds ordered this encounter  Medications   rosuvastatin (CRESTOR) 10 MG tablet    Sig: Take 1 tablet (10 mg total) by mouth daily.    Dispense:  90 tablet    Refill:  1   spironolactone (ALDACTONE) 25 MG tablet    Sig: Take 1 tablet (25 mg total) by mouth daily.    Dispense:  90 tablet    Refill:  1   hydrochlorothiazide (HYDRODIURIL) 25 MG tablet    Sig: Take 1 tablet (25 mg total) by mouth daily.    Dispense:  90 tablet    Refill:  1   atenolol (TENORMIN) 50 MG tablet    Sig: Take 1 tablet (50 mg total) by mouth daily.    Dispense:  90 tablet    Refill:  1   metFORMIN (GLUCOPHAGE) 500 MG tablet    Sig: Take 1 tablet (500 mg total) by mouth 2 (two) times daily with a meal.    Dispense:  180 tablet    Refill:  1   NIFEdipine (PROCARDIA XL/NIFEDICAL-XL) 90 MG 24 hr tablet    Sig: Take 1 tablet (90 mg total) by mouth daily.    Dispense:  90 tablet    Refill:  1   Patient Instructions  Thanks for coming in today.  See information below on the link for RSV vaccine, that can be given at your pharmacy.  You can also check with your insurance to see if they will cover the tetanus vaccine at your pharmacy.  I will check some screening labs today.  No change in medications for now. See information below on urinary frequency.  I will check prostate test and urine test today, but I am also referring you to urology to discuss the symptoms as  well as erectile dysfunction and different treatment options.  Return to the clinic or go to the nearest emergency room if any of your symptoms worsen or new symptoms occur.  [employer].fihttps://www.cdc.gov/vaccines/hcp/vis/vis-statements/rsv.html  Preventive Care 8565 Years and Older, Male Preventive care refers to lifestyle choices and visits with your health care provider that can promote health and wellness. Preventive care visits are also called wellness exams. What can I expect for my preventive care visit? Counseling During your preventive care visit, your health care provider may ask about your: Medical history, including: Past medical problems. Family medical history. History of falls. Current health, including: Emotional well-being. Home life and relationship well-being. Sexual activity. Memory and ability to understand (cognition). Lifestyle, including: Alcohol, nicotine or tobacco, and drug use. Access to firearms. Diet, exercise, and sleep habits. Work and work Astronome[employer]vironment. Sunscreen use. Safety issues such as seatbelt and bike helmet use. Physical exam Your health care provider will check your: Height and weight. These may be used to calculate your BMI (body mass index).  BMI is a measurement that tells if you are at a healthy weight. Waist circumference. This measures the distance around your waistline. This measurement also tells if you are at a healthy weight and may help predict your risk of certain diseases, such as type 2 diabetes and high blood pressure. Heart rate and blood pressure. Body temperature. Skin for abnormal spots. What immunizations do I need?  Vaccines are usually given at various ages, according to a schedule. Your health care provider will recommend vaccines for you based on your age, medical history, and lifestyle or other factors, such as travel or where you work. What tests do I need? Screening Your health care provider may recommend screening tests for  certain conditions. This may include: Lipid and cholesterol levels. Diabetes screening. This is done by checking your blood sugar (glucose) after you have not eaten for a while (fasting). Hepatitis C test. Hepatitis B test. HIV (human immunodeficiency virus) test. STI (sexually transmitted infection) testing, if you are at risk. Lung cancer screening. Colorectal cancer screening. Prostate cancer screening. Abdominal aortic aneurysm (AAA) screening. You may need this if you are a current or former smoker. Talk with your health care provider about your test results, treatment options, and if necessary, the need for more tests. Follow these instructions at home: Eating and drinking  Eat a diet that includes fresh fruits and vegetables, whole grains, lean protein, and low-fat dairy products. Limit your intake of foods with high amounts of sugar, saturated fats, and salt. Take vitamin and mineral supplements as recommended by your health care provider. Do not drink alcohol if your health care provider tells you not to drink. If you drink alcohol: Limit how much you have to 0-2 drinks a day. Know how much alcohol is in your drink. In the [place] one drink equals one 12 oz bottle of beer (355 mL), one 5 oz glass of wine (148 mL), or one 1 oz glass of hard liquor (44 mL). Lifestyle Brush your teeth every morning and night with fluoride toothpaste. Floss one time each day. Exercise for at least 30 minutes 5 or more days each week. Do not use any products that contain nicotine or tobacco. These products include cigarettes, chewing tobacco, and vaping devices, such as e-cigarettes. If you need help quitting, ask your health care provider. Do not use drugs. If you are sexually active, practice safe sex. Use a condom or other form of protection to prevent STIs. Take aspirin only as told by your health care provider. Make sure that you understand how much to take and what form to take. Work with your  health care provider to find out whether it is safe and beneficial for you to take aspirin daily. Ask your health care provider if you need to take a cholesterol-lowering medicine (statin). Find healthy ways to manage stress, such as: Meditation, yoga, or listening to music. Journaling. Talking to a trusted person. Spending time with friends and family. Safety Always wear your seat belt while driving or riding in a vehicle. Do not drive: If you have been drinking alcohol. Do not ride with someone who has been drinking. When you are tired or distracted. While texting. If you have been using any mind-altering substances or drugs. Wear a helmet and other protective equipment during sports activities. If you have firearms in your house, make sure you follow all gun safety procedures. Minimize exposure to UV radiation to reduce your risk of skin cancer. What's next? Visit your health care provider  once a year for an annual wellness visit. Ask your health care provider how often you should have your eyes and teeth checked. Stay up to date on all vaccines. This information is not intended to replace advice given to you by your health care provider. Make sure you discuss any questions you have with your health care provider. Document Revised: 01/11/2021 Document Reviewed: 01/11/2021 Elsevier Patient Education  2023 Elsevier Inc.   Urinary Frequency, Adult Urinary frequency means urinating more often than usual. You may urinate every 1-2 hours even though you drink a normal amount of fluid and do not have a bladder infection or condition. Although you urinate more often than normal, the total amount of urine produced in a day is normal. With urinary frequency, you may have an urgent need to urinate often. The stress and anxiety of needing to find a bathroom quickly can make this urge worse. This condition may go away on its own, or you may need treatment at home. Home treatment may include  bladder training, exercises, taking medicines, or making changes to your diet. Follow these instructions at home: Bladder health Your health care provider will tell you what to do to improve bladder health. You may be told to: Keep a bladder diary. Keep track of: What you eat and drink. How often you urinate. How much you urinate. Follow a bladder training program. This may include: Learning to delay going to the bathroom. Double urinating, also called voiding. This helps if you are not completely emptying your bladder. Scheduled voiding. Do Kegel exercises. Kegel exercises strengthen the muscles that help control urination, which may help the condition.  Eating and drinking Follow instructions from your health care provider about eating or drinking restrictions. You may be told to: Avoid caffeine. Drink fewer fluids, especially alcohol. Avoid drinking in the evening. Avoid foods or drinks that may irritate the bladder. These include coffee, tea, soda, artificial sweeteners, citrus, tomato-based foods, and chocolate. Eat foods that help prevent or treat constipation. Constipation can make urinary frequency worse. You may need to take these actions to prevent or treat constipation: Drink enough fluid to keep your urine pale yellow. Take over-the-counter or prescription medicines. Eat foods that are high in fiber, such as beans, whole grains, and fresh fruits and vegetables. Limit foods that are high in fat and processed sugars, such as fried or sweet foods. General instructions Take over-the-counter and prescription medicines only as told by your health care provider. Keep all follow-up visits. This is important. Contact a health care provider if: You start urinating more often. You feel pain or irritation when you urinate. You notice blood in your urine. Your urine looks cloudy. You develop a fever. You begin vomiting. Get help right away if: You are unable to  urinate. Summary Urinary frequency means urinating more often than usual. With urinary frequency, you may urinate every 1-2 hours even though you drink a normal amount of fluid and do not have a bladder infection or other bladder condition. Your health care provider may recommend that you keep a bladder diary, follow a bladder training program, or make dietary changes. If told by your health care provider, do Kegel exercises to strengthen the muscles that help control urination. Take over-the-counter and prescription medicines only as told by your health care provider. Contact a health care provider if your symptoms do not improve or get worse. This information is not intended to replace advice given to you by your health care provider. Make sure you  discuss any questions you have with your health care provider. Document Revised: 02/19/2020 Document Reviewed: 02/19/2020 Elsevier Patient Education  2023 Elsevier Inc.        Signed,   Meredith Staggers, MD [facility], [facility] 06/14/22 9:57 AM

## 2022-06-14 NOTE — Patient Instructions (Signed)
Thanks for coming in today.  See information below on the link for RSV vaccine, that can be given at your pharmacy.  You can also check with your insurance to see if they will cover the tetanus vaccine at your pharmacy.  I will check some screening labs today.  No change in medications for now. See information below on urinary frequency.  I will check prostate test and urine test today, but I am also referring you to urology to discuss the symptoms as well as erectile dysfunction and different treatment options.  Return to the clinic or go to the nearest emergency room if any of your symptoms worsen or new symptoms occur.  [email or web address]  Preventive Care 20 Years and Older, Male Preventive care refers to lifestyle choices and visits with your health care provider that can promote health and wellness. Preventive care visits are also called wellness exams. What can I expect for my preventive care visit? Counseling During your preventive care visit, your health care provider may ask about your: Medical history, including: Past medical problems. Family medical history. History of falls. Current health, including: Emotional well-being. Home life and relationship well-being. Sexual activity. Memory and ability to understand (cognition). Lifestyle, including: Alcohol, nicotine or tobacco, and drug use. Access to firearms. Diet, exercise, and sleep habits. Work and work Astronomer. Sunscreen use. Safety issues such as seatbelt and bike helmet use. Physical exam Your health care provider will check your: Height and weight. These may be used to calculate your BMI (body mass index). BMI is a measurement that tells if you are at a healthy weight. Waist circumference. This measures the distance around your waistline. This measurement also tells if you are at a healthy weight and may help predict your risk of certain diseases, such as type 2 diabetes  and high blood pressure. Heart rate and blood pressure. Body temperature. Skin for abnormal spots. What immunizations do I need?  Vaccines are usually given at various ages, according to a schedule. Your health care provider will recommend vaccines for you based on your age, medical history, and lifestyle or other factors, such as travel or where you work. What tests do I need? Screening Your health care provider may recommend screening tests for certain conditions. This may include: Lipid and cholesterol levels. Diabetes screening. This is done by checking your blood sugar (glucose) after you have not eaten for a while (fasting). Hepatitis C test. Hepatitis B test. HIV (human immunodeficiency virus) test. STI (sexually transmitted infection) testing, if you are at risk. Lung cancer screening. Colorectal cancer screening. Prostate cancer screening. Abdominal aortic aneurysm (AAA) screening. You may need this if you are a current or former smoker. Talk with your health care provider about your test results, treatment options, and if necessary, the need for more tests. Follow these instructions at home: Eating and drinking  Eat a diet that includes fresh fruits and vegetables, whole grains, lean protein, and low-fat dairy products. Limit your intake of foods with high amounts of sugar, saturated fats, and salt. Take vitamin and mineral supplements as recommended by your health care provider. Do not drink alcohol if your health care provider tells you not to drink. If you drink alcohol: Limit how much you have to 0-2 drinks a day. Know how much alcohol is in your drink. In the [place] one drink equals one 12 oz bottle of beer (355 mL), one 5 oz glass of wine (148 mL), or one 1 oz glass of hard liquor (44 mL). Lifestyle  Brush your teeth every morning and night with fluoride toothpaste. Floss one time each day. Exercise for at least 30 minutes 5 or more days each week. Do not use any  products that contain nicotine or tobacco. These products include cigarettes, chewing tobacco, and vaping devices, such as e-cigarettes. If you need help quitting, ask your health care provider. Do not use drugs. If you are sexually active, practice safe sex. Use a condom or other form of protection to prevent STIs. Take aspirin only as told by your health care provider. Make sure that you understand how much to take and what form to take. Work with your health care provider to find out whether it is safe and beneficial for you to take aspirin daily. Ask your health care provider if you need to take a cholesterol-lowering medicine (statin). Find healthy ways to manage stress, such as: Meditation, yoga, or listening to music. Journaling. Talking to a trusted person. Spending time with friends and family. Safety Always wear your seat belt while driving or riding in a vehicle. Do not drive: If you have been drinking alcohol. Do not ride with someone who has been drinking. When you are tired or distracted. While texting. If you have been using any mind-altering substances or drugs. Wear a helmet and other protective equipment during sports activities. If you have firearms in your house, make sure you follow all gun safety procedures. Minimize exposure to UV radiation to reduce your risk of skin cancer. What's next? Visit your health care provider once a year for an annual wellness visit. Ask your health care provider how often you should have your eyes and teeth checked. Stay up to date on all vaccines. This information is not intended to replace advice given to you by your health care provider. Make sure you discuss any questions you have with your health care provider. Document Revised: 01/11/2021 Document Reviewed: 01/11/2021 Elsevier Patient Education  2023 [employer]   Urinary Frequency, Adult Urinary frequency means urinating more often than usual. You may urinate every 1-2 hours  even though you drink a normal amount of fluid and do not have a bladder infection or condition. Although you urinate more often than normal, the total amount of urine produced in a day is normal. With urinary frequency, you may have an urgent need to urinate often. The stress and anxiety of needing to find a bathroom quickly can make this urge worse. This condition may go away on its own, or you may need treatment at home. Home treatment may include bladder training, exercises, taking medicines, or making changes to your diet. Follow these instructions at home: Bladder health Your health care provider will tell you what to do to improve bladder health. You may be told to: Keep a bladder diary. Keep track of: What you eat and drink. How often you urinate. How much you urinate. Follow a bladder training program. This may include: Learning to delay going to the bathroom. Double urinating, also called voiding. This helps if you are not completely emptying your bladder. Scheduled voiding. Do Kegel exercises. Kegel exercises strengthen the muscles that help control urination, which may help the condition.  Eating and drinking Follow instructions from your health care provider about eating or drinking restrictions. You may be told to: Avoid caffeine. Drink fewer fluids, especially alcohol. Avoid drinking in the evening. Avoid foods or drinks that may irritate the bladder. These include coffee, tea, soda, artificial sweeteners, citrus, tomato-based foods, and chocolate. Eat foods that help  prevent or treat constipation. Constipation can make urinary frequency worse. You may need to take these actions to prevent or treat constipation: Drink enough fluid to keep your urine pale yellow. Take over-the-counter or prescription medicines. Eat foods that are high in fiber, such as beans, whole grains, and fresh fruits and vegetables. Limit foods that are high in fat and processed sugars, such as fried or  sweet foods. General instructions Take over-the-counter and prescription medicines only as told by your health care provider. Keep all follow-up visits. This is important. Contact a health care provider if: You start urinating more often. You feel pain or irritation when you urinate. You notice blood in your urine. Your urine looks cloudy. You develop a fever. You begin vomiting. Get help right away if: You are unable to urinate. Summary Urinary frequency means urinating more often than usual. With urinary frequency, you may urinate every 1-2 hours even though you drink a normal amount of fluid and do not have a bladder infection or other bladder condition. Your health care provider may recommend that you keep a bladder diary, follow a bladder training program, or make dietary changes. If told by your health care provider, do Kegel exercises to strengthen the muscles that help control urination. Take over-the-counter and prescription medicines only as told by your health care provider. Contact a health care provider if your symptoms do not improve or get worse. This information is not intended to replace advice given to you by your health care provider. Make sure you discuss any questions you have with your health care provider. Document Revised: 02/19/2020 Document Reviewed: 02/19/2020 Elsevier Patient Education  2023 [employer].

## 2022-06-15 LAB — HEPATITIS C ANTIBODY: Hepatitis C Ab: NONREACTIVE

## 2022-07-03 ENCOUNTER — Ambulatory Visit (INDEPENDENT_AMBULATORY_CARE_PROVIDER_SITE_OTHER): Payer: Medicare PPO | Admitting: Urology

## 2022-07-03 ENCOUNTER — Encounter: Payer: Self-pay | Admitting: Urology

## 2022-07-03 VITALS — BP 150/74 | HR 80 | Ht 72.0 in | Wt 182.0 lb

## 2022-07-03 DIAGNOSIS — R351 Nocturia: Secondary | ICD-10-CM | POA: Diagnosis not present

## 2022-07-03 DIAGNOSIS — N529 Male erectile dysfunction, unspecified: Secondary | ICD-10-CM | POA: Diagnosis not present

## 2022-07-03 LAB — URINALYSIS
Blood, UA: NEGATIVE
Glucose, UA: NEGATIVE mg/dL
Ketones, UA: NEGATIVE
Leukocytes, UA: NEGATIVE
Nitrite, UA: NEGATIVE
Protein, UA: NEGATIVE
Spec Grav, UA: 1.015 (ref 1.010–1.025)
pH, UA: 5.5 (ref 5.0–8.0)

## 2022-07-03 MED ORDER — TADALAFIL 20 MG PO TABS: 20.0000 mg | ORAL_TABLET | Freq: Every day | ORAL | 11 refills | Status: DC | PRN

## 2022-07-03 NOTE — Progress Notes (Addendum)
Assessment: 1. Organic impotence   2. Nocturia     Plan: I reviewed the patient's chart including provider notes and laboratory results. Today I had a long discussion with the patient spending a total of 15 minutes discussing ED.  I discussed the pathophysiology, etiology, and natural history of ED as well as management options using a goal-oriented approach.  We discussed the following options: Medical therapy, vacuum erection device, penile injections, intraurethral suppository therapy (MUSE), and penile prosthesis. Patient educational materials concerning these options were given to the patient.  Trial of tadalafil 20 mg prn.  Rx sent. Return to office in 2 months  Chief Complaint:  Chief Complaint  Patient presents with   Erectile Dysfunction    History of Present Illness:  Jerry Rangel is a 71 y.o. male who is seen in consultation from Shade Flood, MD for evaluation of erectile dysfunction.  He has had symptoms for approximately 20 years.  He has previously been evaluated by Dr. Julien Girt in [place].  He has been using sildenafil 100 mg for a number of years.  He has noted some gradual worsening of his erectile dysfunction within the past 3-4 years.  He is able to achieve only a partial erection with the sildenafil.  He reports approximately 50% rigidity.  He is able to penetrate and ejaculate.  No nocturnal or early morning erections.  No pain or curvature with erection.  No decrease in his libido.  He has not tried any other medical therapy. Risk factors for erectile dysfunction include diabetes, high blood pressure, and high cholesterol.  He also takes atenolol. He reports nocturia x 3.  He has urinary frequency in the morning, after taking hydrochlorothiazide and spironolactone.  No dysuria or gross hematuria. IPSS = 15 today.  PSA 11/23:  1.0  Past Medical History:  Past Medical History:  Diagnosis Date   Diabetes mellitus without complication (HCC)    Erectile  dysfunction    Hyperlipidemia    Hypertension     Past Surgical History:  Past Surgical History:  Procedure Laterality Date   KNEE SURGERY Right     Allergies:  Allergies  Allergen Reactions   Ace Inhibitors Swelling    Family History:  Family History  Problem Relation Age of Onset   Heart disease Mother    Kidney disease Mother    Diabetes Mother    Hypertension Mother    Kidney disease Sister    Heart disease Sister    Depression Sister     Social History:  Social History   Tobacco Use   Smoking status: Never   Smokeless tobacco: Never  Substance Use Topics   Alcohol use: Yes    Alcohol/week: 2.0 standard drinks of alcohol    Types: 2 Glasses of wine per week   Drug use: No    Review of symptoms:  Constitutional:  Negative for unexplained weight loss, night sweats, fever, chills ENT:  Negative for nose bleeds, sinus pain, painful swallowing CV:  Negative for chest pain, shortness of breath, exercise intolerance, palpitations, loss of consciousness Resp:  Negative for cough, wheezing, shortness of breath GI:  Negative for nausea, vomiting, diarrhea, bloody stools GU:  Positives noted in HPI; otherwise negative for gross hematuria, dysuria, urinary incontinence Neuro:  Negative for seizures, poor balance, limb weakness, slurred speech Psych:  Negative for lack of energy, depression, anxiety Endocrine:  Negative for polydipsia, polyuria, symptoms of hypoglycemia (dizziness, hunger, sweating) Hematologic:  Negative for anemia, purpura, petechia, prolonged or  excessive bleeding, use of anticoagulants  Allergic:  Negative for difficulty breathing or choking as a result of exposure to anything; no shellfish allergy; no allergic response (rash/itch) to materials, foods  Physical exam: BP (!) 150/74   Pulse 80   Ht 6' (1.829 m)   Wt 182 lb (82.6 kg)   BMI 24.68 kg/m  GENERAL APPEARANCE:  Well appearing, well developed, well nourished, NAD HEENT: Atraumatic,  Normocephalic, oropharynx clear. NECK: Supple without lymphadenopathy or thyromegaly. LUNGS: Clear to auscultation bilaterally. HEART: Regular Rate and Rhythm without murmurs, gallops, or rubs. ABDOMEN: Soft, non-tender, No Masses. EXTREMITIES: Moves all extremities well.  Without clubbing, cyanosis, or edema. NEUROLOGIC:  Alert and oriented x 3, normal gait, CN II-XII grossly intact.  MENTAL STATUS:  Appropriate. BACK:  Non-tender to palpation.  No CVAT SKIN:  Warm, dry and intact.   GU: Penis:  circumcised Meatus: Normal Scrotum: normal, no masses Testis: normal without masses bilateral Epididymis: normal Prostate: 30 g, NT, no nodules Rectum: Normal tone,  no masses or tenderness   Results: Results for orders placed or performed in visit on 07/03/22 (from the past 24 hour(s))  Urinalysis     Status: None   Collection Time: 07/03/22 12:00 AM  Result Value Ref Range   Spec Grav, UA 1.015 1.010 - 1.025   Leukocytes, UA Negative Negative   Clarity, UA clear    Protein, UA Negative Negative   Ketones, UA negative    Bilirubin, UA nevative    Blood, UA negative    pH, UA 5.5 5.0 - 8.0   Nitrite, UA negaitve    Glucose, UA negative negative mg/dL

## 2022-08-22 IMAGING — DX DG KNEE COMPLETE 4+V*R*
4 series · 4 of 4 positions shown · non-contrast
Comparison: None.

CLINICAL DATA: Right knee pain after fall

EXAM:
RIGHT KNEE - COMPLETE 4+ VIEW

[knee ap]
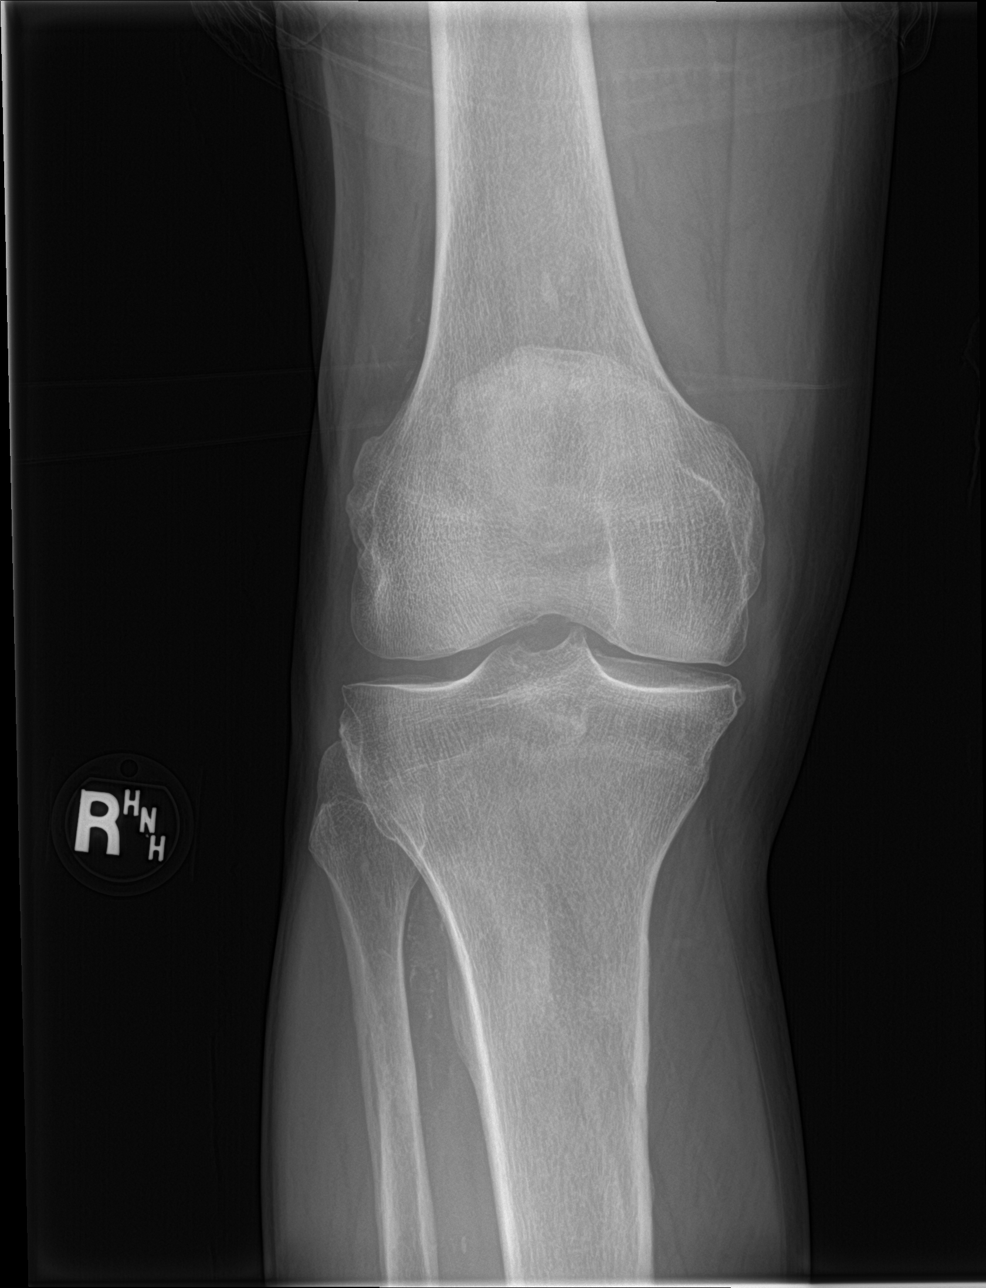

[knee obl (1 of 2)]
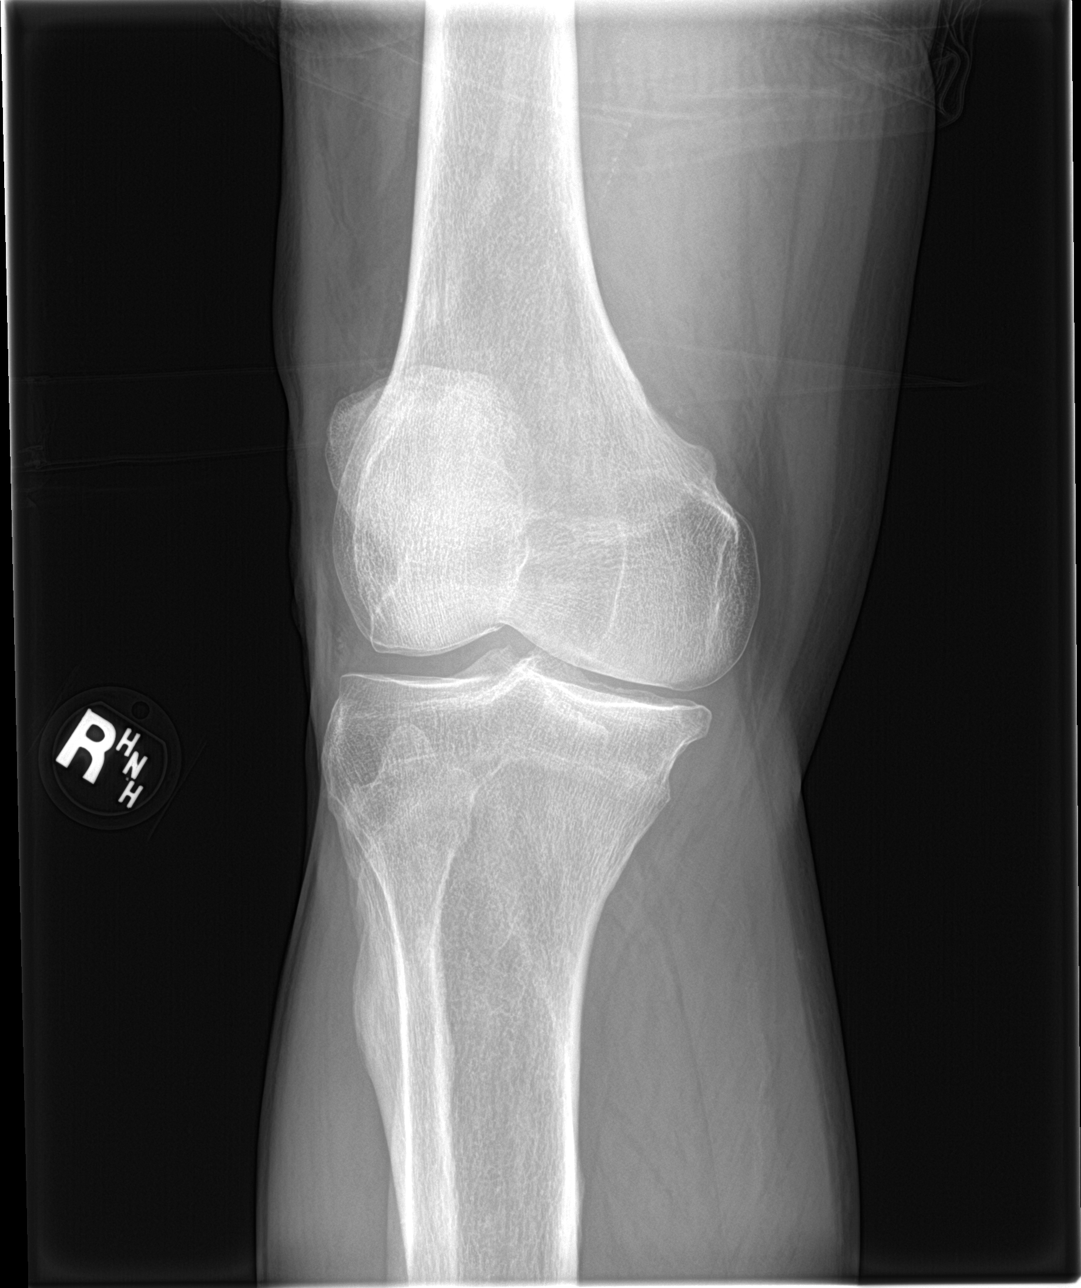

[knee obl (2 of 2)]
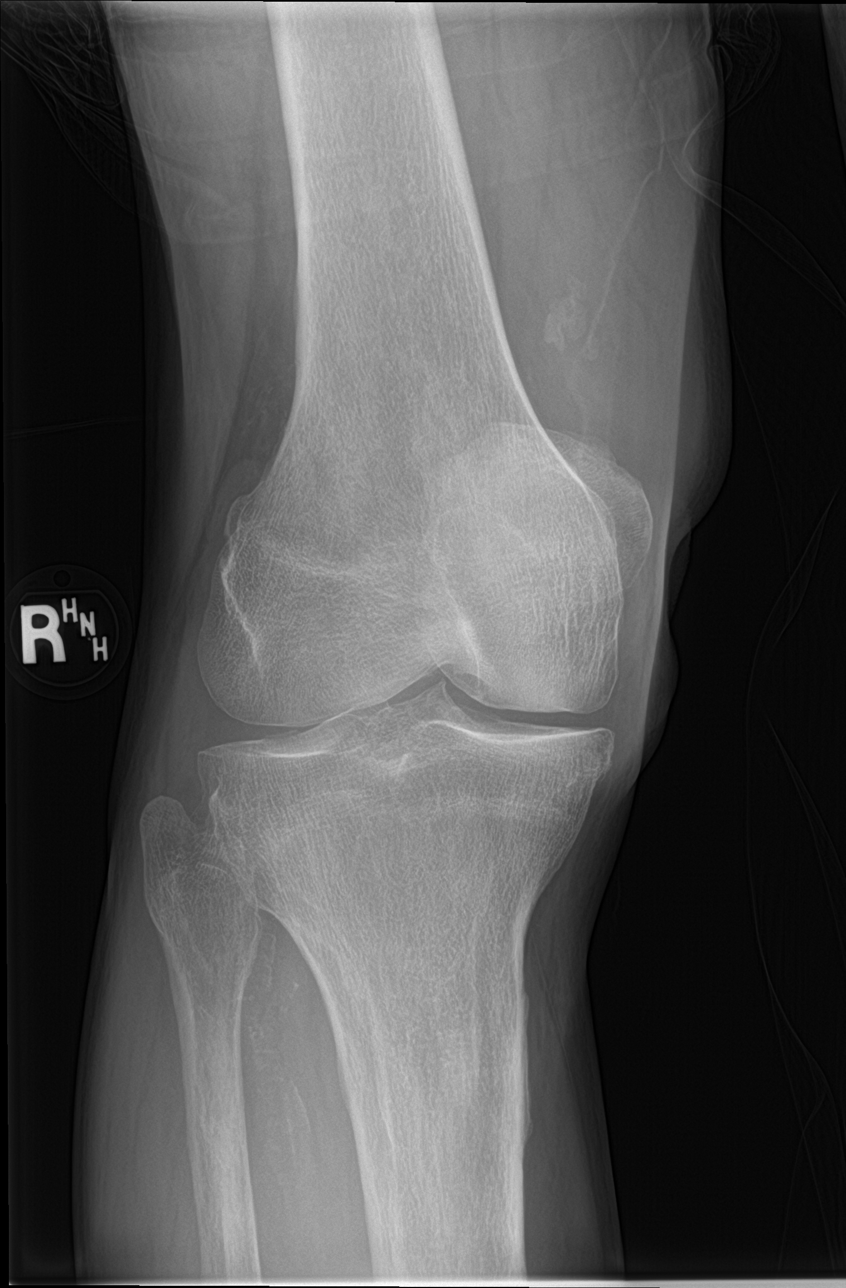

[knee lat]
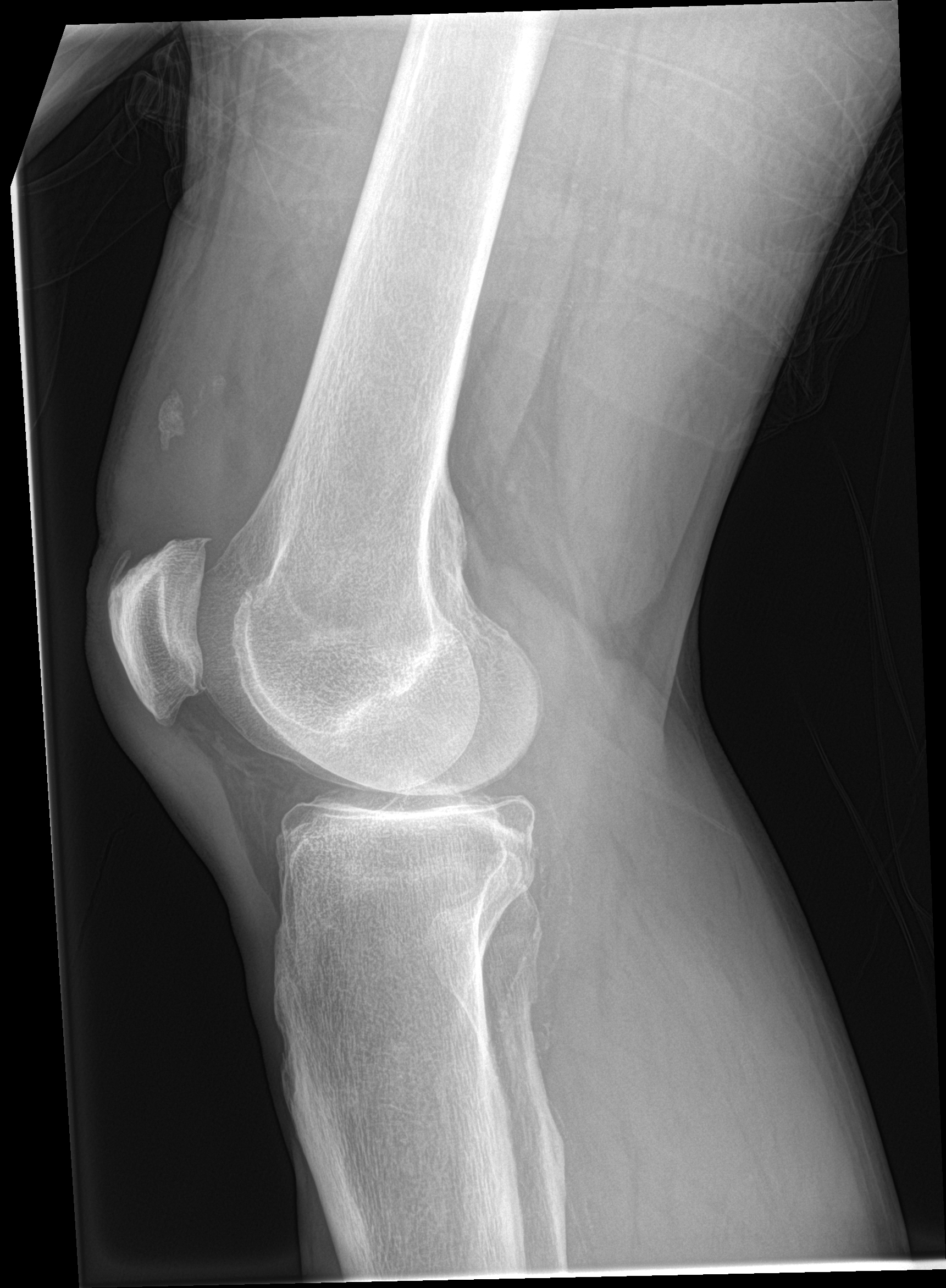

[4 of 4 positions shown; findings below may reference images not displayed]

FINDINGS: 1.5 cm mineralized density projects within the anterior soft tissues
of the knee approximately 3.0 cm superior to the patella,
nonspecific but could reflect a bony avulsion fracture fragment
related to quadriceps tendon injury. Osseous structures are
otherwise intact. Mild medial compartment osteoarthritis. Small knee
joint effusion. Soft tissue swelling anteriorly. Vascular
calcifications are noted.
IMPRESSION: 1. Small mineralized density projects within the anterior soft
tissues of the knee superior to the patella, nonspecific but could
reflect a bony avulsion fracture fragment related to quadriceps
tendon injury.
2. Small knee joint effusion.
3. Mild medial compartment osteoarthritis.

## 2022-09-04 ENCOUNTER — Ambulatory Visit: Payer: Medicare PPO | Admitting: Urology

## 2022-10-09 DIAGNOSIS — H5213 Myopia, bilateral: Secondary | ICD-10-CM | POA: Diagnosis not present

## 2022-10-09 DIAGNOSIS — H524 Presbyopia: Secondary | ICD-10-CM | POA: Diagnosis not present

## 2022-10-09 DIAGNOSIS — Z7984 Long term (current) use of oral hypoglycemic drugs: Secondary | ICD-10-CM | POA: Diagnosis not present

## 2022-10-09 DIAGNOSIS — E1136 Type 2 diabetes mellitus with diabetic cataract: Secondary | ICD-10-CM | POA: Diagnosis not present

## 2022-10-09 DIAGNOSIS — H2513 Age-related nuclear cataract, bilateral: Secondary | ICD-10-CM | POA: Diagnosis not present

## 2022-10-09 LAB — HM DIABETES EYE EXAM

## 2022-10-17 ENCOUNTER — Ambulatory Visit: Payer: Medicare HMO | Admitting: Urology

## 2022-10-17 ENCOUNTER — Encounter: Payer: Self-pay | Admitting: Urology

## 2022-10-17 VITALS — BP 160/76 | HR 73 | Ht 72.0 in | Wt 183.0 lb

## 2022-10-17 DIAGNOSIS — R351 Nocturia: Secondary | ICD-10-CM | POA: Diagnosis not present

## 2022-10-17 DIAGNOSIS — N529 Male erectile dysfunction, unspecified: Secondary | ICD-10-CM | POA: Diagnosis not present

## 2022-10-17 MED ORDER — TADALAFIL 20 MG PO TABS: 20.0000 mg | ORAL_TABLET | Freq: Every day | ORAL | 11 refills | Status: DC | PRN

## 2022-10-17 NOTE — Progress Notes (Signed)
Assessment: 1. Organic impotence   2. Nocturia     Plan: Continue tadalafil 20 mg prn. Return to office in 1 year  Chief Complaint:  Chief Complaint  Patient presents with   Erectile Dysfunction    History of Present Illness:  Jerry Rangel is a 72 y.o. male who is seen for further evaluation of erectile dysfunction.  He has had symptoms for approximately 20 years.  He had previously been evaluated by Dr. Janice Norrie in [place].  He had been using sildenafil 100 mg for a number of years.  He noted some gradual worsening of his erectile dysfunction within the past 3-4 years.  He is able to achieve only a partial erection with the sildenafil with approximately 50% rigidity.  He was able to penetrate and ejaculate.  No nocturnal or early morning erections.  No pain or curvature with erection.  No decrease in his libido.  He has not tried any other medical therapy. Risk factors for erectile dysfunction include diabetes, high blood pressure, and high cholesterol.  He also takes atenolol.  He reports nocturia x 3.  He has urinary frequency in the morning, after taking hydrochlorothiazide and spironolactone.  No dysuria or gross hematuria. IPSS = 15.  PSA 11/23:  1.0  He was given a trial of tadalafil 20 mg as needed at his visit in 12/23.  He returns today for scheduled follow-up.  He reports that the tadalafil is working better for him.  He is able to achieve an adequate erection for intercourse.  He is able to ejaculate.  No side effects from the medication.  He continues to have some lower urinary tract symptoms including nocturia.  He associates this with his diuretic use.  No dysuria or gross hematuria. IPSS = 7 today.  Portions of the above documentation were copied from a prior visit for review purposes only.   Past Medical History:  Past Medical History:  Diagnosis Date   Diabetes mellitus without complication ([place])    Erectile dysfunction    Hyperlipidemia    Hypertension      Past Surgical History:  Past Surgical History:  Procedure Laterality Date   KNEE SURGERY Right     Allergies:  Allergies  Allergen Reactions   Ace Inhibitors Swelling    Family History:  Family History  Problem Relation Age of Onset   Heart disease Mother    Kidney disease Mother    Diabetes Mother    Hypertension Mother    Kidney disease Sister    Heart disease Sister    Depression Sister     Social History:  Social History   Tobacco Use   Smoking status: Never   Smokeless tobacco: Never  Substance Use Topics   Alcohol use: Yes    Alcohol/week: 2.0 standard drinks of alcohol    Types: 2 Glasses of wine per week   Drug use: No    ROS: Constitutional:  Negative for fever, chills, weight loss CV: Negative for chest pain, previous MI, hypertension Respiratory:  Negative for shortness of breath, wheezing, sleep apnea, frequent cough GI:  Negative for nausea, vomiting, bloody stool, GERD  Physical exam: BP (!) 160/76   Pulse 73   Ht 6' (1.829 m)   Wt 183 lb (83 kg)   BMI 24.82 kg/m  GENERAL APPEARANCE:  Well appearing, well developed, well nourished, NAD HEENT:  Atraumatic, normocephalic, oropharynx clear NECK:  Supple without lymphadenopathy or thyromegaly ABDOMEN:  Soft, non-tender, no masses EXTREMITIES:  Moves all  extremities well, without clubbing, cyanosis, or edema NEUROLOGIC:  Alert and oriented x 3, normal gait, CN II-XII grossly intact MENTAL STATUS:  appropriate BACK:  Non-tender to palpation, No CVAT SKIN:  Warm, dry, and intact   Results: None

## 2022-11-05 ENCOUNTER — Other Ambulatory Visit: Payer: Self-pay

## 2022-11-05 ENCOUNTER — Other Ambulatory Visit: Payer: Self-pay | Admitting: Family Medicine

## 2022-12-13 ENCOUNTER — Ambulatory Visit (INDEPENDENT_AMBULATORY_CARE_PROVIDER_SITE_OTHER): Payer: Medicare HMO | Admitting: Family Medicine

## 2022-12-13 ENCOUNTER — Encounter: Payer: Self-pay | Admitting: Family Medicine

## 2022-12-13 VITALS — BP 130/70 | HR 63 | Temp 99.1°F | Wt 180.6 lb

## 2022-12-13 DIAGNOSIS — E1169 Type 2 diabetes mellitus with other specified complication: Secondary | ICD-10-CM | POA: Diagnosis not present

## 2022-12-13 DIAGNOSIS — M7989 Other specified soft tissue disorders: Secondary | ICD-10-CM

## 2022-12-13 DIAGNOSIS — G4700 Insomnia, unspecified: Secondary | ICD-10-CM | POA: Diagnosis not present

## 2022-12-13 DIAGNOSIS — E785 Hyperlipidemia, unspecified: Secondary | ICD-10-CM | POA: Diagnosis not present

## 2022-12-13 DIAGNOSIS — I10 Essential (primary) hypertension: Secondary | ICD-10-CM | POA: Diagnosis not present

## 2022-12-13 DIAGNOSIS — Z7984 Long term (current) use of oral hypoglycemic drugs: Secondary | ICD-10-CM | POA: Diagnosis not present

## 2022-12-13 DIAGNOSIS — Z1211 Encounter for screening for malignant neoplasm of colon: Secondary | ICD-10-CM

## 2022-12-13 LAB — HEMOGLOBIN A1C: Hgb A1c MFr Bld: 6.6 % — ABNORMAL HIGH (ref 4.6–6.5)

## 2022-12-13 MED ORDER — HYDROCHLOROTHIAZIDE 25 MG PO TABS: 25.0000 mg | ORAL_TABLET | Freq: Every day | ORAL | 1 refills | Status: DC

## 2022-12-13 MED ORDER — ROSUVASTATIN CALCIUM 10 MG PO TABS: 10.0000 mg | ORAL_TABLET | Freq: Every day | ORAL | 1 refills | Status: DC

## 2022-12-13 MED ORDER — ATENOLOL 50 MG PO TABS: 50.0000 mg | ORAL_TABLET | Freq: Every day | ORAL | 1 refills | Status: DC

## 2022-12-13 MED ORDER — SPIRONOLACTONE 25 MG PO TABS: 25.0000 mg | ORAL_TABLET | Freq: Every day | ORAL | 1 refills | Status: DC

## 2022-12-13 MED ORDER — METFORMIN HCL 500 MG PO TABS: 500.0000 mg | ORAL_TABLET | Freq: Two times a day (BID) | ORAL | 1 refills | Status: DC

## 2022-12-13 MED ORDER — NIFEDIPINE ER OSMOTIC RELEASE 90 MG PO TB24: 90.0000 mg | ORAL_TABLET | Freq: Every day | ORAL | 1 refills | Status: DC

## 2022-12-13 NOTE — Progress Notes (Signed)
Subjective:  Patient ID: Jerry Rangel, male    DOB: 1951/01/01  Age: 72 y.o. MRN: [id number]  CC:  Chief Complaint  Patient presents with   Follow-up    6 month follow up. Some of his toes on his right foot have been swelling and sore.    HPI Jerry Rangel presents for   Diabetes: With microalbuminuria treated with metformin.  History of angioedema, avoiding ACE inhibitor's.  Metformin 500 mg twice daily and walking for exercise. Referred to urology for nocturia, frequency, erectile dysfunction at his last visit.  Followed by Dr. Pete Glatter.  Treated with tadalafil. Jerry Rangel is on statin. Home readings: 125-145 postprandial, no fasting readings.  No symptomatic lows. No new med side effects.  Microalbumin: Normal ratio 06/14/2022 Optho, foot exam, pneumovax: Up-to-date  Lab Results  Component Value Date   HGBA1C 6.6 (H) 06/14/2022   HGBA1C 6.0 12/06/2021   HGBA1C 5.6 06/08/2021   Lab Results  Component Value Date   MICROALBUR 1.7 06/14/2022   LDLCALC 66 06/14/2022   CREATININE 1.29 06/14/2022   Hypertension: Treated with nifedipine atenolol spironolactone HCTZ.  Elevated in March, stable today.no new side effects with meds, or new symptoms Home readings: up to 140. Exercising every other day prior, slowed down then restarted recently.   BP Readings from Last 3 Encounters:  12/13/22 130/70  10/17/22 (!) 160/76  07/03/22 (!) 150/74   Lab Results  Component Value Date   CREATININE 1.29 06/14/2022   Hyperlipidemia: Treated with Crestor 10 mg daily, no new myalgias/side effects.  Lab Results  Component Value Date   CHOL 131 06/14/2022   HDL 35.70 (L) 06/14/2022   LDLCALC 66 06/14/2022   TRIG 148.0 06/14/2022   CHOLHDL 4 06/14/2022   Lab Results  Component Value Date   ALT 41 06/14/2022   AST 33 06/14/2022   ALKPHOS 48 06/14/2022   BILITOT 0.5 06/14/2022   Toe pain/swelling R foot 2nd-4th toes. Swelling initially, improved with warm soaks, and epsom salt. No  wounds, no d/c or bleeding. Sore. Started after using otc treatment for toenail fungus. Stopped using that product.   Insomnia: Zquil works well, no longer using the trazodone. No side effects. No urinary side effects.   Due for repeat cologuard. Negative 09/28/19.    History Patient Active Problem List   Diagnosis Date Noted   Nocturia 07/03/2022   HTN (hypertension) 10/07/2012   Other and unspecified hyperlipidemia 10/07/2012   Organic impotence 10/07/2012   Past Medical History:  Diagnosis Date   Diabetes mellitus without complication (HCC)    Erectile dysfunction    Hyperlipidemia    Hypertension    Past Surgical History:  Procedure Laterality Date   KNEE SURGERY Right    Allergies  Allergen Reactions   Ace Inhibitors Swelling   Prior to Admission medications   Medication Sig Start Date End Date Taking? Authorizing Provider  aspirin 81 MG tablet Take 81 mg by mouth daily.   Yes [provider]  atenolol (TENORMIN) 50 MG tablet Take 1 tablet (50 mg total) by mouth daily. 06/14/22  Yes Shade Flood, MD  hydrochlorothiazide (HYDRODIURIL) 25 MG tablet Take 1 tablet (25 mg total) by mouth daily. 06/14/22  Yes Shade Flood, MD  metFORMIN (GLUCOPHAGE) 500 MG tablet Take 1 tablet (500 mg total) by mouth 2 (two) times daily with a meal. 06/14/22  Yes Shade Flood, MD  NIFEdipine (PROCARDIA XL/NIFEDICAL-XL) 90 MG 24 hr tablet Take 1 tablet (90 mg total) by mouth  daily. 06/14/22  Yes Shade Flood, MD  [facility] VERIO test strip as directed. 09/07/20  Yes [provider]  rosuvastatin (CRESTOR) 10 MG tablet Take 1 tablet (10 mg total) by mouth daily. 06/14/22  Yes Shade Flood, MD  spironolactone (ALDACTONE) 25 MG tablet Take 1 tablet (25 mg total) by mouth daily. 06/14/22  Yes Shade Flood, MD  tadalafil (CIALIS) 20 MG tablet Take 1 tablet (20 mg total) by mouth daily as needed for erectile dysfunction. 10/17/22  Yes Stoneking, Danford Bad.,  MD  traZODone (DESYREL) 50 MG tablet Take 50-100 mg by mouth at bedtime as needed. 12/19/20  Yes [provider]   Social History   Socioeconomic History   Marital status: Married    Spouse name: Not on file   Number of children: Not on file   Years of education: Not on file   Highest education level: Not on file  Occupational History   Not on file  Tobacco Use   Smoking status: Never   Smokeless tobacco: Never  Substance and Sexual Activity   Alcohol use: Yes    Alcohol/week: 2.0 standard drinks of alcohol    Types: 2 Glasses of wine per week   Drug use: No   Sexual activity: Yes    Birth control/protection: None  Other Topics Concern   Not on file  Social History Narrative   Not on file   Social Determinants of Health   Financial Resource Strain: Not on file  Food Insecurity: Not on file  Transportation Needs: Not on file  Physical Activity: Not on file  Stress: Not on file  Social Connections: Not on file  Intimate Partner Violence: Not on file    Review of Systems  Constitutional:  Negative for fatigue and unexpected weight change.  Eyes:  Negative for visual disturbance.  Respiratory:  Negative for cough, chest tightness and shortness of breath.   Cardiovascular:  Negative for chest pain, palpitations and leg swelling.  Gastrointestinal:  Negative for abdominal pain and blood in stool.  Neurological:  Negative for dizziness, light-headedness and headaches.     Objective:   Vitals:   12/13/22 0920  BP: 130/70  Pulse: 63  Temp: 99.1 F (37.3 C)  SpO2: 99%  Weight: 180 lb 9.6 oz (81.9 kg)     Physical Exam Vitals reviewed.  Constitutional:      Appearance: Jerry Rangel is well-developed.  HENT:     Head: Normocephalic and atraumatic.  Neck:     Vascular: No carotid bruit or JVD.  Cardiovascular:     Rate and Rhythm: Normal rate and regular rhythm.     Heart sounds: Normal heart sounds. No murmur heard. Pulmonary:     Effort: Pulmonary effort  is normal.     Breath sounds: Normal breath sounds. No rales.  Musculoskeletal:     Right lower leg: No edema.     Left lower leg: No edema.  Skin:    General: Skin is warm and dry.     Comments: Thickened, onychomycotic second toenail on right with some slight soft tissue swelling, hyperpigmentation of skin overlying second through fourth toes.  No wounds, nontender.  Cap refill less than 1 second, see photo  Neurological:     Mental Status: Jerry Rangel is alert and oriented to person, place, and time.  Psychiatric:        Mood and Affect: Mood normal.         Assessment & Plan:  Azende Alger is  a 72 y.o. male . Hyperlipidemia, unspecified hyperlipidemia type - Plan: Lipid panel, Comp Met (CMET), rosuvastatin (CRESTOR) 10 MG tablet  -  Stable, tolerating current regimen. Medications refilled. Labs pending as above.   Type 2 diabetes mellitus with hyperlipidemia (HCC) - Plan: Comp Met (CMET), Hemoglobin A1c, metFORMIN (GLUCOPHAGE) 500 MG tablet  -  Stable, tolerating current regimen. Medications refilled. Labs pending as above.   Essential hypertension - Plan: atenolol (TENORMIN) 50 MG tablet, hydrochlorothiazide (HYDRODIURIL) 25 MG tablet, NIFEdipine (PROCARDIA XL/NIFEDICAL-XL) 90 MG 24 hr tablet, spironolactone (ALDACTONE) 25 MG tablet  -  Stable, tolerating current regimen. Medications refilled. Labs pending as above.  Improved readings in office versus home readings, RTC precautions if persistent elevated home readings.  Screen for colon cancer - Plan: Cologuard  Insomnia, unspecified type  -Stable ZzzQuil, option of trazodone or ZzzQuil.  Denies side effects or anticholinergic side effects with ZzzQuil.  RTC precautions  Toe swelling Noted after use of over-the-counter topical onychomycosis treatment.  Has since stopped that treatment with improvement in swelling.  Slight hyperpigmentation possibly from previous reaction, again improving at this time, no wounds and nontender.   Continue to watch for improvement with RTC precautions and avoid further use of previous over-the-counter treatment.  Meds ordered this encounter  Medications   atenolol (TENORMIN) 50 MG tablet    Sig: Take 1 tablet (50 mg total) by mouth daily.    Dispense:  90 tablet    Refill:  1   hydrochlorothiazide (HYDRODIURIL) 25 MG tablet    Sig: Take 1 tablet (25 mg total) by mouth daily.    Dispense:  90 tablet    Refill:  1   metFORMIN (GLUCOPHAGE) 500 MG tablet    Sig: Take 1 tablet (500 mg total) by mouth 2 (two) times daily with a meal.    Dispense:  180 tablet    Refill:  1   NIFEdipine (PROCARDIA XL/NIFEDICAL-XL) 90 MG 24 hr tablet    Sig: Take 1 tablet (90 mg total) by mouth daily.    Dispense:  90 tablet    Refill:  1   rosuvastatin (CRESTOR) 10 MG tablet    Sig: Take 1 tablet (10 mg total) by mouth daily.    Dispense:  90 tablet    Refill:  1   spironolactone (ALDACTONE) 25 MG tablet    Sig: Take 1 tablet (25 mg total) by mouth daily.    Dispense:  90 tablet    Refill:  1   Patient Instructions  Toe swelling may have been a reaction to the previous over-the-counter product.  Imply that is improving.  If not continuing to improve and returning to normal in the next few weeks, return for recheck.  Sooner if any new or worsening symptoms.  No change in medications for now, I will let you know if there are any concerns on your labs.  Take care.      Signed,   Meredith Staggers, MD [facility], [facility] 12/13/22 10:12 AM

## 2022-12-13 NOTE — Addendum Note (Signed)
Addended by: Meredith Staggers R on: 12/13/2022 11:36 AM   Modules accepted: Orders

## 2022-12-13 NOTE — Patient Instructions (Signed)
Toe swelling may have been a reaction to the previous over-the-counter product.  Imply that is improving.  If not continuing to improve and returning to normal in the next few weeks, return for recheck.  Sooner if any new or worsening symptoms.  No change in medications for now, I will let you know if there are any concerns on your labs.  Take care.

## 2022-12-14 LAB — COMPREHENSIVE METABOLIC PANEL
ALT: 44 U/L (ref 0–53)
AST: 33 U/L (ref 0–37)
Albumin: 4.7 g/dL (ref 3.5–5.2)
Alkaline Phosphatase: 45 U/L (ref 39–117)
BUN: 16 mg/dL (ref 6–23)
CO2: 25 mEq/L (ref 19–32)
Calcium: 10.1 mg/dL (ref 8.4–10.5)
Chloride: 101 mEq/L (ref 96–112)
Creatinine, Ser: 1.32 mg/dL (ref 0.40–1.50)
GFR: 54.21 mL/min — ABNORMAL LOW (ref >60.00)
Glucose, Bld: 141 mg/dL — ABNORMAL HIGH (ref 70–99)
Potassium: 4.1 mEq/L (ref 3.5–5.1)
Sodium: 138 mEq/L (ref 135–145)
Total Bilirubin: 0.3 mg/dL (ref 0.2–1.2)
Total Protein: 7.6 g/dL (ref 6.0–8.3)

## 2022-12-14 LAB — LIPID PANEL
Cholesterol: 125 mg/dL (ref 0–200)
HDL: 35.9 mg/dL — ABNORMAL LOW (ref >39.00)
LDL Cholesterol: 61 mg/dL (ref 0–99)
NonHDL: 88.87
Total CHOL/HDL Ratio: 3
Triglycerides: 138 mg/dL (ref 0.0–149.0)
VLDL: 27.6 mg/dL (ref 0.0–40.0)

## 2022-12-25 ENCOUNTER — Telehealth: Payer: Self-pay

## 2022-12-25 NOTE — Telephone Encounter (Signed)
-----   Message from Shade Flood, MD sent at 12/25/2022  2:14 PM EDT ----- Lab letter  Cholesterol levels were overall stable.  Blood sugar elevated, similar to previous readings, kidney function test slightly higher than previous range, but minimal change.  Make sure to drink plenty of fluids, avoid NSAIDs like Advil or Aleve and recheck in the next few months.  57-month blood sugar test looked stable.  Let me know if there are questions.  Dr. Neva Seat

## 2022-12-26 ENCOUNTER — Ambulatory Visit (INDEPENDENT_AMBULATORY_CARE_PROVIDER_SITE_OTHER): Payer: Medicare HMO | Admitting: *Deleted

## 2022-12-26 DIAGNOSIS — Z0000 Encounter for general adult medical examination without abnormal findings: Secondary | ICD-10-CM | POA: Diagnosis not present

## 2022-12-26 NOTE — Progress Notes (Signed)
Subjective:   Jerry Rangel is a 72 y.o. male who presents for Medicare Annual/Subsequent preventive examination.  I connected with  Jerry Rangel on 12/26/22 by a telephone enabled telemedicine application and verified that I am speaking with the correct person using two identifiers.   I discussed the limitations of evaluation and management by telemedicine. The patient expressed understanding and agreed to proceed.  Patient location: home  Provider location: telephone -home    Review of Systems      no Cardiac Risk Factors include: advanced age (>22men, >72 women);diabetes mellitus;male gender;hypertension;family history of premature cardiovascular disease     Objective:    Today's Vitals   There is no height or weight on file to calculate BMI.     12/26/2022   11:06 AM  Advanced Directives  Does Patient Have a Medical Advance Directive? No  Would patient like information on creating a medical advance directive? No - Patient declined    Current Medications (verified) Outpatient Encounter Medications as of 12/26/2022  Medication Sig   aspirin 81 MG tablet Take 81 mg by mouth daily.   atenolol (TENORMIN) 50 MG tablet Take 1 tablet (50 mg total) by mouth daily.   hydrochlorothiazide (HYDRODIURIL) 25 MG tablet Take 1 tablet (25 mg total) by mouth daily.   metFORMIN (GLUCOPHAGE) 500 MG tablet Take 1 tablet (500 mg total) by mouth 2 (two) times daily with a meal.   NIFEdipine (PROCARDIA XL/NIFEDICAL-XL) 90 MG 24 hr tablet Take 1 tablet (90 mg total) by mouth daily.   ONETOUCH VERIO test strip as directed.   rosuvastatin (CRESTOR) 10 MG tablet Take 1 tablet (10 mg total) by mouth daily.   spironolactone (ALDACTONE) 25 MG tablet Take 1 tablet (25 mg total) by mouth daily.   tadalafil (CIALIS) 20 MG tablet Take 1 tablet (20 mg total) by mouth daily as needed for erectile dysfunction.   traZODone (DESYREL) 50 MG tablet Take 50-100 mg by mouth at bedtime as needed.   No  facility-administered encounter medications on file as of 12/26/2022.    Allergies (verified) Ace inhibitors   History: Past Medical History:  Diagnosis Date   Diabetes mellitus without complication (HCC)    Erectile dysfunction    Hyperlipidemia    Hypertension    Past Surgical History:  Procedure Laterality Date   KNEE SURGERY Right    Family History  Problem Relation Age of Onset   Heart disease Mother    Kidney disease Mother    Diabetes Mother    Hypertension Mother    Kidney disease Sister    Heart disease Sister    Depression Sister    Social History   Socioeconomic History   Marital status: Married    Spouse name: Not on file   Number of children: Not on file   Years of education: Not on file   Highest education level: Not on file  Occupational History   Not on file  Tobacco Use   Smoking status: Never   Smokeless tobacco: Never  Substance and Sexual Activity   Alcohol use: Yes    Alcohol/week: 2.0 standard drinks of alcohol    Types: 2 Glasses of wine per week   Drug use: No   Sexual activity: Yes    Birth control/protection: None  Other Topics Concern   Not on file  Social History Narrative   Not on file   Social Determinants of Health   Financial Resource Strain: Low Risk  (12/26/2022)   Overall Financial  Resource Strain (CARDIA)    Difficulty of Paying Living Expenses: Not hard at all  Food Insecurity: No Food Insecurity (12/26/2022)   Hunger Vital Sign    Worried About Running Out of Food in the Last Year: Never true    Ran Out of Food in the Last Year: Never true  Transportation Needs: No Transportation Needs (12/26/2022)   PRAPARE - Administrator, Civil Service (Medical): No    Lack of Transportation (Non-Medical): No  Physical Activity: Sufficiently Active (12/26/2022)   Exercise Vital Sign    Days of Exercise per Week: 4 days    Minutes of Exercise per Session: 40 min  Stress: No Stress Concern Present (12/26/2022)    [employer] of Occupational Health - Occupational Stress Questionnaire    Feeling of Stress : Not at all  Social Connections: Moderately Integrated (12/26/2022)   Social Connection and Isolation Panel [NHANES]    Frequency of Communication with Friends and Family: Twice a week    Frequency of Social Gatherings with Friends and Family: Once a week    Attends Religious Services: More than 4 times per year    Active Member of [employer] or Organizations: No    Attends Engineer, structural: Never    Marital Status: Married    Tobacco Counseling Counseling given: Not Answered   Clinical Intake:  Pre-visit preparation completed: Yes  Pain : No/denies pain        How often do you need to have someone help you when you read instructions, pamphlets, or other written materials from your doctor or pharmacy?: 1 - Never  Diabetic?  Yes  Nutrition Risk Assessment:  Has the patient had any N/V/D within the last 2 months?  No  Does the patient have any non-healing wounds?  No  Has the patient had any unintentional weight loss or weight gain?  No   Diabetes:  Is the patient diabetic?  Yes  If diabetic, was a CBG obtained today?  No  Did the patient bring in their glucometer from home?  No  How often do you monitor your CBG's? Every other day.   Financial Strains and Diabetes Management:  Are you having any financial strains with the device, your supplies or your medication? No .  Does the patient want to be seen by Chronic Care Management for management of their diabetes?  No  Would the patient like to be referred to a Nutritionist or for Diabetic Management?  No   Diabetic Exams:  Diabetic Eye Exam: Completed .  Pt has been advised about the importance in completing this exam. A referral has been placed today. Message sent to referral coordinator for scheduling purposes. Advised pt to expect a call from office referred to regarding appt.  Diabetic Foot Exam: . Pt has  been advised about the importance in completing this exam.  Interpreter Needed?: No  Information entered by :: Remi Haggard LPN   Activities of Daily Living    12/26/2022   11:05 AM  In your present state of health, do you have any difficulty performing the following activities:  Hearing? 0  Vision? 0  Difficulty concentrating or making decisions? 0  Walking or climbing stairs? 0  Dressing or bathing? 0  Doing errands, shopping? 0  Preparing Food and eating ? N  Using the Toilet? N  In the past six months, have you accidently leaked urine? N  Do you have problems with loss of bowel control? N  Managing  your Medications? N  Managing your Finances? N  Housekeeping or managing your Housekeeping? N    Patient Care Team: Shade Flood, MD as PCP - General (Family Medicine)  Indicate any recent Medical Services you may have received from other than [facility] providers in the past year (date may be approximate).     Assessment:   This is a routine wellness examination for [place].  Hearing/Vision screen Hearing Screening - Comments:: No trouble hearing Vision Screening - Comments:: Up to date [facility]  Dietary issues and exercise activities discussed:     Goals Addressed             This Visit's Progress    Patient Stated       Stay healthy       Depression Screen    12/26/2022   11:09 AM 12/13/2022    9:26 AM 06/14/2022    8:51 AM 12/06/2021    9:06 AM 09/04/2021    2:40 PM 06/01/2021    2:49 PM 09/28/2020   11:28 AM  PHQ 2/9 Scores  PHQ - 2 Score 0 0 0 0 0 0 0  PHQ- 9 Score 2 0 3        Fall Risk    12/26/2022   11:04 AM 12/13/2022    9:26 AM 06/14/2022    8:51 AM 12/06/2021    9:06 AM 09/04/2021    2:40 PM  Fall Risk   Falls in the past year? 0 0 0 0 0  Number falls in past yr: 0 0 0 0 0  Injury with Fall? 0 0 0 0 0  Risk for fall due to :  No Fall Risks No Fall Risks No Fall Risks No Fall Risks  Follow up Falls evaluation completed;Education  provided;Falls prevention discussed Falls evaluation completed Falls evaluation completed Falls evaluation completed Falls evaluation completed    FALL RISK PREVENTION PERTAINING TO THE HOME:  Any stairs in or around the home? No  If so, are there any without handrails? No  Home free of loose throw rugs in walkways, pet beds, electrical cords, etc? Yes  Adequate lighting in your home to reduce risk of falls? Yes   ASSISTIVE DEVICES UTILIZED TO PREVENT FALLS:  Life alert? No  Use of a cane, walker or w/c? No  Grab bars in the bathroom? No  Shower chair or bench in shower? No  Elevated toilet seat or a handicapped toilet? No   TIMED UP AND GO:  Was the test performed? No .    Cognitive Function:        12/26/2022   11:17 AM 12/26/2022   11:06 AM  6CIT Screen  What Year? 0 points 0 points  What month? 0 points 0 points  What time? 0 points 0 points  Count back from 20 0 points 0 points  Months in reverse 0 points 0 points  Repeat phrase 0 points 0 points  Total Score 0 points 0 points    Immunizations Immunization History  Administered Date(s) Administered   DTaP 12/31/2006   Fluad Quad(high Dose 65+) 06/01/2021, 06/14/2022   Influenza, Quadrivalent, Recombinant, Inj, Pf 05/15/2018, 05/13/2019   Influenza-Unspecified 04/29/2018   Pneumococcal Conjugate-13 11/07/2017   Pneumococcal Polysaccharide-23 09/04/2021    TDAP status: Due, Education has been provided regarding the importance of this vaccine. Advised may receive this vaccine at local pharmacy or Health Dept. Aware to provide a copy of the vaccination record if obtained from local pharmacy or Health Dept. Verbalized  acceptance and understanding.  Flu Vaccine status: Up to date  Pneumococcal vaccine status: Up to date  Covid-19 vaccine status: Information provided on how to obtain vaccines.   Qualifies for Shingles Vaccine? Yes   Zostavax completed No   Shingrix Completed?: No.    Education has been  provided regarding the importance of this vaccine. Patient has been advised to call insurance company to determine out of pocket expense if they have not yet received this vaccine. Advised may also receive vaccine at local pharmacy or Health Dept. Verbalized acceptance and understanding.  Screening Tests Health Maintenance  Topic Date Due   Zoster Vaccines- Shingrix (1 of 2) Never done   DTaP/Tdap/Td (2 - Tdap) 12/30/2016   COVID-19 Vaccine (1) 01/11/2023 (Originally 10/14/1951)   INFLUENZA VACCINE  02/28/2023   Diabetic kidney evaluation - Urine ACR  06/15/2023   Diabetic kidney evaluation - eGFR measurement  12/13/2023   Medicare Annual Wellness (AWV)  12/26/2023   Colonoscopy  09/27/2029   Pneumonia Vaccine 19+ Years old  Completed   Hepatitis C Screening  Completed   HPV VACCINES  Aged Out    Health Maintenance  Health Maintenance Due  Topic Date Due   Zoster Vaccines- Shingrix (1 of 2) Never done   DTaP/Tdap/Td (2 - Tdap) 12/30/2016    Colorectal cancer screening: Type of screening: Colonoscopy. Completed 2021. Repeat every 10 years  Lung Cancer Screening: (Low Dose CT Chest recommended if Age 28-80 years, 30 pack-year currently smoking OR have quit w/in 15years.) does not qualify.   Lung Cancer Screening Referral:   Additional Screening:  Hepatitis C Screening: does not qualify; Completed 2023  Vision Screening: Recommended annual ophthalmology exams for early detection of glaucoma and other disorders of the eye. Is the patient up to date with their annual eye exam?  Yes  Who is the provider or what is the name of the office in which the patient attends annual eye exams? [facility] If pt is not established with a provider, would they like to be referred to a provider to establish care? No .   Dental Screening: Recommended annual dental exams for proper oral hygiene  Community Resource Referral / Chronic Care Management: CRR required this visit?  No   CCM required this  visit?  No      Plan:     I have personally reviewed and noted the following in the patient's chart:   Medical and social history Use of alcohol, tobacco or illicit drugs  Current medications and supplements including opioid prescriptions. Patient is not currently taking opioid prescriptions. Functional ability and status Nutritional status Physical activity Advanced directives List of other physicians Hospitalizations, surgeries, and ER visits in previous 12 months Vitals Screenings to include cognitive, depression, and falls Referrals and appointments  In addition, I have reviewed and discussed with patient certain preventive protocols, quality metrics, and best practice recommendations. A written personalized care plan for preventive services as well as general preventive health recommendations were provided to patient.     Remi Haggard, LPN   10/26/5186   Nurse Notes:

## 2022-12-26 NOTE — Patient Instructions (Signed)
Jerry Rangel , Thank you for taking time to come for your Medicare Wellness Visit. I appreciate your ongoing commitment to your health goals. Please review the following plan we discussed and let me know if I can assist you in the future.   Screening recommendations/referrals: Colonoscopy: up to date Recommended yearly ophthalmology/optometry visit for glaucoma screening and checkup Recommended yearly dental visit for hygiene and checkup  Vaccinations: Influenza vaccine: up to date Pneumococcal vaccine: up to date Tdap vaccine: Education provided Shingles vaccine: Education provided    Advanced directives: Education provided    Preventive Care 65 Years and Older, Male Preventive care refers to lifestyle choices and visits with your health care provider that can promote health and wellness. What does preventive care include? A yearly physical exam. This is also called an annual well check. Dental exams once or twice a year. Routine eye exams. Ask your health care provider how often you should have your eyes checked. Personal lifestyle choices, including: Daily care of your teeth and gums. Regular physical activity. Eating a healthy diet. Avoiding tobacco and drug use. Limiting alcohol use. Practicing safe sex. Taking low doses of aspirin every day. Taking vitamin and mineral supplements as recommended by your health care provider. What happens during an annual well check? The services and screenings done by your health care provider during your annual well check will depend on your age, overall health, lifestyle risk factors, and family history of disease. Counseling  Your health care provider may ask you questions about your: Alcohol use. Tobacco use. Drug use. Emotional well-being. Home and relationship well-being. Sexual activity. Eating habits. History of falls. Memory and ability to understand (cognition). Work and work Astronomer. Screening  You may have the  following tests or measurements: Height, weight, and BMI. Blood pressure. Lipid and cholesterol levels. These may be checked every 5 years, or more frequently if you are over 22 years old. Skin check. Lung cancer screening. You may have this screening every year starting at age 49 if you have a 30-pack-year history of smoking and currently smoke or have quit within the past 15 years. Fecal occult blood test (FOBT) of the stool. You may have this test every year starting at age 58. Flexible sigmoidoscopy or colonoscopy. You may have a sigmoidoscopy every 5 years or a colonoscopy every 10 years starting at age 76. Prostate cancer screening. Recommendations will vary depending on your family history and other risks. Hepatitis C blood test. Hepatitis B blood test. Sexually transmitted disease (STD) testing. Diabetes screening. This is done by checking your blood sugar (glucose) after you have not eaten for a while (fasting). You may have this done every 1-3 years. Abdominal aortic aneurysm (AAA) screening. You may need this if you are a current or former smoker. Osteoporosis. You may be screened starting at age 37 if you are at high risk. Talk with your health care provider about your test results, treatment options, and if necessary, the need for more tests. Vaccines  Your health care provider may recommend certain vaccines, such as: Influenza vaccine. This is recommended every year. Tetanus, diphtheria, and acellular pertussis (Tdap, Td) vaccine. You may need a Td booster every 10 years. Zoster vaccine. You may need this after age 56. Pneumococcal 13-valent conjugate (PCV13) vaccine. One dose is recommended after age 50. Pneumococcal polysaccharide (PPSV23) vaccine. One dose is recommended after age 61. Talk to your health care provider about which screenings and vaccines you need and how often you need them. This information  is not intended to replace advice given to you by your health care  provider. Make sure you discuss any questions you have with your health care provider. Document Released: 08/12/2015 Document Revised: 04/04/2016 Document Reviewed: 05/17/2015 Elsevier Interactive Patient Education  2017 [employer].  Fall Prevention in the Home Falls can cause injuries. They can happen to people of all ages. There are many things you can do to make your home safe and to help prevent falls. What can I do on the outside of my home? Regularly fix the edges of walkways and driveways and fix any cracks. Remove anything that might make you trip as you walk through a door, such as a raised step or threshold. Trim any bushes or trees on the path to your home. Use bright outdoor lighting. Clear any walking paths of anything that might make someone trip, such as rocks or tools. Regularly check to see if handrails are loose or broken. Make sure that both sides of any steps have handrails. Any raised decks and porches should have guardrails on the edges. Have any leaves, snow, or ice cleared regularly. Use sand or salt on walking paths during winter. Clean up any spills in your garage right away. This includes oil or grease spills. What can I do in the bathroom? Use night lights. Install grab bars by the toilet and in the tub and shower. Do not use towel bars as grab bars. Use non-skid mats or decals in the tub or shower. If you need to sit down in the shower, use a plastic, non-slip stool. Keep the floor dry. Clean up any water that spills on the floor as soon as it happens. Remove soap buildup in the tub or shower regularly. Attach bath mats securely with double-sided non-slip rug tape. Do not have throw rugs and other things on the floor that can make you trip. What can I do in the bedroom? Use night lights. Make sure that you have a light by your bed that is easy to reach. Do not use any sheets or blankets that are too big for your bed. They should not hang down onto the  floor. Have a firm chair that has side arms. You can use this for support while you get dressed. Do not have throw rugs and other things on the floor that can make you trip. What can I do in the kitchen? Clean up any spills right away. Avoid walking on wet floors. Keep items that you use a lot in easy-to-reach places. If you need to reach something above you, use a strong step stool that has a grab bar. Keep electrical cords out of the way. Do not use floor polish or wax that makes floors slippery. If you must use wax, use non-skid floor wax. Do not have throw rugs and other things on the floor that can make you trip. What can I do with my stairs? Do not leave any items on the stairs. Make sure that there are handrails on both sides of the stairs and use them. Fix handrails that are broken or loose. Make sure that handrails are as long as the stairways. Check any carpeting to make sure that it is firmly attached to the stairs. Fix any carpet that is loose or worn. Avoid having throw rugs at the top or bottom of the stairs. If you do have throw rugs, attach them to the floor with carpet tape. Make sure that you have a light switch at the top of  the stairs and the bottom of the stairs. If you do not have them, ask someone to add them for you. What else can I do to help prevent falls? Wear shoes that: Do not have high heels. Have rubber bottoms. Are comfortable and fit you well. Are closed at the toe. Do not wear sandals. If you use a stepladder: Make sure that it is fully opened. Do not climb a closed stepladder. Make sure that both sides of the stepladder are locked into place. Ask someone to hold it for you, if possible. Clearly mark and make sure that you can see: Any grab bars or handrails. First and last steps. Where the edge of each step is. Use tools that help you move around (mobility aids) if they are needed. These include: Canes. Walkers. Scooters. Crutches. Turn on the  lights when you go into a dark area. Replace any light bulbs as soon as they burn out. Set up your furniture so you have a clear path. Avoid moving your furniture around. If any of your floors are uneven, fix them. If there are any pets around you, be aware of where they are. Review your medicines with your doctor. Some medicines can make you feel dizzy. This can increase your chance of falling. Ask your doctor what other things that you can do to help prevent falls. This information is not intended to replace advice given to you by your health care provider. Make sure you discuss any questions you have with your health care provider. Document Released: 05/12/2009 Document Revised: 12/22/2015 Document Reviewed: 08/20/2014 Elsevier Interactive Patient Education  2017 [employer].

## 2023-01-05 LAB — COLOGUARD

## 2023-01-07 NOTE — Progress Notes (Signed)
Left vm to call office

## 2023-01-15 ENCOUNTER — Telehealth: Payer: Self-pay

## 2023-01-15 NOTE — Telephone Encounter (Signed)
Pt called in regards to his [place]gard test . I explained to the pt that [facility] will contact him for another sample as the first one was not enough to run

## 2023-01-22 DIAGNOSIS — Z1211 Encounter for screening for malignant neoplasm of colon: Secondary | ICD-10-CM | POA: Diagnosis not present

## 2023-01-26 LAB — COLOGUARD: COLOGUARD: NEGATIVE

## 2023-02-01 ENCOUNTER — Encounter: Payer: Self-pay | Admitting: Family Medicine

## 2023-03-13 ENCOUNTER — Ambulatory Visit (INDEPENDENT_AMBULATORY_CARE_PROVIDER_SITE_OTHER): Payer: Medicare HMO | Admitting: Family Medicine

## 2023-03-13 VITALS — BP 136/74 | HR 69 | Temp 98.0°F | Ht 72.0 in | Wt 178.4 lb

## 2023-03-13 DIAGNOSIS — G4700 Insomnia, unspecified: Secondary | ICD-10-CM

## 2023-03-13 DIAGNOSIS — M79674 Pain in right toe(s): Secondary | ICD-10-CM

## 2023-03-13 DIAGNOSIS — R2241 Localized swelling, mass and lump, right lower limb: Secondary | ICD-10-CM

## 2023-03-13 NOTE — Patient Instructions (Addendum)
I will refer you to podiatry, but have xray of foot first: [facility] or xray: Walk in 8:30-4:30 during weekdays, no appointment needed [address]  [place], [place] [address] Ok to continue soaks if needed, Return to the clinic or go to the nearest emergency room if any of your symptoms worsen or new symptoms occur.  Zquil ok temporarily, but see info below. Let me know if that is not improving in next few weeks.   Insomnia Insomnia is a sleep disorder that makes it difficult to fall asleep or stay asleep. Insomnia can cause fatigue, low energy, difficulty concentrating, mood swings, and poor performance at work or school. There are three different ways to classify insomnia: Difficulty falling asleep. Difficulty staying asleep. Waking up too early in the morning. Any type of insomnia can be long-term (chronic) or short-term (acute). Both are common. Short-term insomnia usually lasts for 3 months or less. Chronic insomnia occurs at least three times a week for longer than 3 months. What are the causes? Insomnia may be caused by another condition, situation, or substance, such as: Having certain mental health conditions, such as anxiety and depression. Using caffeine, alcohol, tobacco, or drugs. Having gastrointestinal conditions, such as gastroesophageal reflux disease (GERD). Having certain medical conditions. These include: Asthma. Alzheimer's disease. Stroke. Chronic pain. An overactive thyroid gland (hyperthyroidism). Other sleep disorders, such as restless legs syndrome and sleep apnea. Menopause. Sometimes, the cause of insomnia may not be known. What increases the risk? Risk factors for insomnia include: Gender. Females are affected more often than males. Age. Insomnia is more common as people get older. Stress and certain medical and mental health conditions. Lack of exercise. Having an irregular work schedule. This may include working night shifts and traveling  between different time zones. What are the signs or symptoms? If you have insomnia, the main symptom is having trouble falling asleep or having trouble staying asleep. This may lead to other symptoms, such as: Feeling tired or having low energy. Feeling nervous about going to sleep. Not feeling rested in the morning. Having trouble concentrating. Feeling irritable, anxious, or depressed. How is this diagnosed? This condition may be diagnosed based on: Your symptoms and medical history. Your health care provider may ask about: Your sleep habits. Any medical conditions you have. Your mental health. A physical exam. How is this treated? Treatment for insomnia depends on the cause. Treatment may focus on treating an underlying condition that is causing the insomnia. Treatment may also include: Medicines to help you sleep. Counseling or therapy. Lifestyle adjustments to help you sleep better. Follow these instructions at home: Eating and drinking  Limit or avoid alcohol, caffeinated beverages, and products that contain nicotine and tobacco, especially close to bedtime. These can disrupt your sleep. Do not eat a large meal or eat spicy foods right before bedtime. This can lead to digestive discomfort that can make it hard for you to sleep. Sleep habits  Keep a sleep diary to help you and your health care provider figure out what could be causing your insomnia. Write down: When you sleep. When you wake up during the night. How well you sleep and how rested you feel the next day. Any side effects of medicines you are taking. What you eat and drink. Make your bedroom a dark, comfortable place where it is easy to fall asleep. Put up shades or blackout curtains to block light from outside. Use a white noise machine to block noise. Keep the temperature cool.  Limit screen use before bedtime. This includes: Not watching TV. Not using your smartphone, tablet, or computer. Stick to a routine  that includes going to bed and waking up at the same times every day and night. This can help you fall asleep faster. Consider making a quiet activity, such as reading, part of your nighttime routine. Try to avoid taking naps during the day so that you sleep better at night. Get out of bed if you are still awake after 15 minutes of trying to sleep. Keep the lights down, but try reading or doing a quiet activity. When you feel sleepy, go back to bed. General instructions Take over-the-counter and prescription medicines only as told by your health care provider. Exercise regularly as told by your health care provider. However, avoid exercising in the hours right before bedtime. Use relaxation techniques to manage stress. Ask your health care provider to suggest some techniques that may work well for you. These may include: Breathing exercises. Routines to release muscle tension. Visualizing peaceful scenes. Make sure that you drive carefully. Do not drive if you feel very sleepy. Keep all follow-up visits. This is important. Contact a health care provider if: You are tired throughout the day. You have trouble in your daily routine due to sleepiness. You continue to have sleep problems, or your sleep problems get worse. Get help right away if: You have thoughts about hurting yourself or someone else. Get help right away if you feel like you may hurt yourself or others, or have thoughts about taking your own life. Go to your nearest emergency room or: Call 911. Call the National Suicide Prevention Lifeline at (865)596-1816 or 988. This is open 24 hours a day. Text the Crisis Text Line at 910-011-1696. Summary Insomnia is a sleep disorder that makes it difficult to fall asleep or stay asleep. Insomnia can be long-term (chronic) or short-term (acute). Treatment for insomnia depends on the cause. Treatment may focus on treating an underlying condition that is causing the insomnia. Keep a sleep diary to  help you and your health care provider figure out what could be causing your insomnia. This information is not intended to replace advice given to you by your health care provider. Make sure you discuss any questions you have with your health care provider. Document Revised: 06/26/2021 Document Reviewed: 06/26/2021 Elsevier Patient Education  2024 [employer].

## 2023-03-13 NOTE — Progress Notes (Unsigned)
Subjective:  Patient ID: Jerry Rangel, male    DOB: April 14, 1951  Age: 72 y.o. MRN: [id number]  CC:  Chief Complaint  Patient presents with   Foot Swelling    Pt reports swelling of the Rt foot, pt has been doing epsom salt soaks and this does help with some swelling, pt notes intermittent swelling.     HPI Jerry Rangel presents for   Right foot swelling As above, intermittent swelling, treated with home Epsom salt soaks.  We discussed some right foot 2nd through 4th toe swelling at his May 16 visit.  Had reported initial swelling that improved with soaks and Epsom salt at that time.  Noted after using over-the-counter treatment for toenail fungus and had since stopped using that product. No history of gout. Same area of swelling.notes some soreness at times, swelling every few days.  No redness or warmth, no wounds, no injury.  Tx: Vaseline or topical alcohol at times.   Insomnia Past 2 weeks, waking up every 2 hours - unknown reason. No changes at home or stressors.  Denies anxiety/stress. Nocturia stable - 2x/night.  Zquil effective, but not wanting to take nightly.  No PND, chest pain or dyspnea.  No afternoon caffeine.     History Patient Active Problem List   Diagnosis Date Noted   Nocturia 07/03/2022   HTN (hypertension) 10/07/2012   Other and unspecified hyperlipidemia 10/07/2012   Organic impotence 10/07/2012   Past Medical History:  Diagnosis Date   Diabetes mellitus without complication (HCC)    Erectile dysfunction    Hyperlipidemia    Hypertension    Past Surgical History:  Procedure Laterality Date   KNEE SURGERY Right    Allergies  Allergen Reactions   Ace Inhibitors Swelling   Prior to Admission medications   Medication Sig Start Date End Date Taking? Authorizing Provider  aspirin 81 MG tablet Take 81 mg by mouth daily.   Yes [provider]  atenolol (TENORMIN) 50 MG tablet Take 1 tablet (50 mg total) by mouth daily. 12/13/22  Yes  Shade Flood, MD  hydrochlorothiazide (HYDRODIURIL) 25 MG tablet Take 1 tablet (25 mg total) by mouth daily. 12/13/22  Yes Shade Flood, MD  metFORMIN (GLUCOPHAGE) 500 MG tablet Take 1 tablet (500 mg total) by mouth 2 (two) times daily with a meal. 12/13/22  Yes Shade Flood, MD  NIFEdipine (PROCARDIA XL/NIFEDICAL-XL) 90 MG 24 hr tablet Take 1 tablet (90 mg total) by mouth daily. 12/13/22  Yes Shade Flood, MD  [facility] VERIO test strip as directed. 09/07/20  Yes [provider]  rosuvastatin (CRESTOR) 10 MG tablet Take 1 tablet (10 mg total) by mouth daily. 12/13/22  Yes Shade Flood, MD  spironolactone (ALDACTONE) 25 MG tablet Take 1 tablet (25 mg total) by mouth daily. 12/13/22  Yes Shade Flood, MD  tadalafil (CIALIS) 20 MG tablet Take 1 tablet (20 mg total) by mouth daily as needed for erectile dysfunction. 10/17/22  Yes Stoneking, Danford Bad., MD  traZODone (DESYREL) 50 MG tablet Take 50-100 mg by mouth at bedtime as needed. 12/19/20  Yes [provider]   Social History   Socioeconomic History   Marital status: Married    Spouse name: Not on file   Number of children: Not on file   Years of education: Not on file   Highest education level: Not on file  Occupational History   Not on file  Tobacco Use   Smoking status: Never  Smokeless tobacco: Never  Substance and Sexual Activity   Alcohol use: Yes    Alcohol/week: 2.0 standard drinks of alcohol    Types: 2 Glasses of wine per week   Drug use: No   Sexual activity: Yes    Birth control/protection: None  Other Topics Concern   Not on file  Social History Narrative   Not on file   Social Determinants of Health   Financial Resource Strain: Low Risk  (12/26/2022)   Overall Financial Resource Strain (CARDIA)    Difficulty of Paying Living Expenses: Not hard at all  Food Insecurity: No Food Insecurity (12/26/2022)   Hunger Vital Sign    Worried About Running Out of Food in the Last Year:  Never true    Ran Out of Food in the Last Year: Never true  Transportation Needs: No Transportation Needs (12/26/2022)   PRAPARE - Administrator, Civil Service (Medical): No    Lack of Transportation (Non-Medical): No  Physical Activity: Sufficiently Active (12/26/2022)   Exercise Vital Sign    Days of Exercise per Week: 4 days    Minutes of Exercise per Session: 40 min  Stress: No Stress Concern Present (12/26/2022)   [employer] of Occupational Health - Occupational Stress Questionnaire    Feeling of Stress : Not at all  Social Connections: Moderately Integrated (12/26/2022)   Social Connection and Isolation Panel [NHANES]    Frequency of Communication with Friends and Family: Twice a week    Frequency of Social Gatherings with Friends and Family: Once a week    Attends Religious Services: More than 4 times per year    Active Member of [employer] or Organizations: No    Attends Banker Meetings: Never    Marital Status: Married  Catering manager Violence: Not At Risk (12/26/2022)   Humiliation, Afraid, Rape, and Kick questionnaire    Fear of Current or Ex-Partner: No    Emotionally Abused: No    Physically Abused: No    Sexually Abused: No    Review of Systems  Per HPI.  Objective:   Vitals:   03/13/23 0945  BP: 136/74  Pulse: 69  Temp: 98 F (36.7 C)  TempSrc: Temporal  SpO2: 99%  Weight: 178 lb 6.4 oz (80.9 kg)  Height: 6' (1.829 m)     Physical Exam Vitals reviewed.  Constitutional:      General: He is not in acute distress.    Appearance: Normal appearance. He is well-developed.  HENT:     Head: Normocephalic and atraumatic.     Nose: Congestion: .imag.  Cardiovascular:     Rate and Rhythm: Normal rate.  Pulmonary:     Effort: Pulmonary effort is normal.  Musculoskeletal:     Comments: Right foot, soft tissue swelling at distal second toe greater than third toe with some hyperpigmentation of those toes.  No focal bony tenderness  of foot or specific wounds noted of toes.  Cap refill less than 1 second at toes.  Neurological:     Mental Status: He is alert and oriented to person, place, and time.  Psychiatric:        Mood and Affect: Mood normal.      Assessment & Plan:  Jerry Rangel is a 72 y.o. male . Localized swelling of right foot - Plan: DG Toe 2nd Right, Ambulatory referral to Podiatry Pain of toe of right foot - Plan: DG Toe 2nd Right, Ambulatory referral to Podiatry  -Recurrent distal  foot swelling, primarily at toes.  Some stasis changes noted/hyperpigmentation.  Question small joint arthropathy, with intermittent recurrence.  Less likely contact dermatitis from previous toenail treatment.  No wounds, no injuries.  Referred to podiatry and imaging ordered with RTC precautions.  Okay to continue soaks if that is helpful  Insomnia, unspecified type  -Recent symptoms, handout given on sleep hygiene, temporary use of ZzzQuil with RTC precautions if persistent.  No orders of the defined types were placed in this encounter.  Patient Instructions  I will refer you to podiatry, but have xray of foot first: [facility] or xray: Walk in 8:30-4:30 during weekdays, no appointment needed [address]  [place], [place] [address] Ok to continue soaks if needed, Return to the clinic or go to the nearest emergency room if any of your symptoms worsen or new symptoms occur.  Zquil ok temporarily, but see info below. Let me know if that is not improving in next few weeks.   Insomnia Insomnia is a sleep disorder that makes it difficult to fall asleep or stay asleep. Insomnia can cause fatigue, low energy, difficulty concentrating, mood swings, and poor performance at work or school. There are three different ways to classify insomnia: Difficulty falling asleep. Difficulty staying asleep. Waking up too early in the morning. Any type of insomnia can be long-term (chronic) or short-term (acute). Both are common.  Short-term insomnia usually lasts for 3 months or less. Chronic insomnia occurs at least three times a week for longer than 3 months. What are the causes? Insomnia may be caused by another condition, situation, or substance, such as: Having certain mental health conditions, such as anxiety and depression. Using caffeine, alcohol, tobacco, or drugs. Having gastrointestinal conditions, such as gastroesophageal reflux disease (GERD). Having certain medical conditions. These include: Asthma. Alzheimer's disease. Stroke. Chronic pain. An overactive thyroid gland (hyperthyroidism). Other sleep disorders, such as restless legs syndrome and sleep apnea. Menopause. Sometimes, the cause of insomnia may not be known. What increases the risk? Risk factors for insomnia include: Gender. Females are affected more often than males. Age. Insomnia is more common as people get older. Stress and certain medical and mental health conditions. Lack of exercise. Having an irregular work schedule. This may include working night shifts and traveling between different time zones. What are the signs or symptoms? If you have insomnia, the main symptom is having trouble falling asleep or having trouble staying asleep. This may lead to other symptoms, such as: Feeling tired or having low energy. Feeling nervous about going to sleep. Not feeling rested in the morning. Having trouble concentrating. Feeling irritable, anxious, or depressed. How is this diagnosed? This condition may be diagnosed based on: Your symptoms and medical history. Your health care provider may ask about: Your sleep habits. Any medical conditions you have. Your mental health. A physical exam. How is this treated? Treatment for insomnia depends on the cause. Treatment may focus on treating an underlying condition that is causing the insomnia. Treatment may also include: Medicines to help you sleep. Counseling or therapy. Lifestyle  adjustments to help you sleep better. Follow these instructions at home: Eating and drinking  Limit or avoid alcohol, caffeinated beverages, and products that contain nicotine and tobacco, especially close to bedtime. These can disrupt your sleep. Do not eat a large meal or eat spicy foods right before bedtime. This can lead to digestive discomfort that can make it hard for you to sleep. Sleep habits  Keep a sleep diary to  help you and your health care provider figure out what could be causing your insomnia. Write down: When you sleep. When you wake up during the night. How well you sleep and how rested you feel the next day. Any side effects of medicines you are taking. What you eat and drink. Make your bedroom a dark, comfortable place where it is easy to fall asleep. Put up shades or blackout curtains to block light from outside. Use a white noise machine to block noise. Keep the temperature cool. Limit screen use before bedtime. This includes: Not watching TV. Not using your smartphone, tablet, or computer. Stick to a routine that includes going to bed and waking up at the same times every day and night. This can help you fall asleep faster. Consider making a quiet activity, such as reading, part of your nighttime routine. Try to avoid taking naps during the day so that you sleep better at night. Get out of bed if you are still awake after 15 minutes of trying to sleep. Keep the lights down, but try reading or doing a quiet activity. When you feel sleepy, go back to bed. General instructions Take over-the-counter and prescription medicines only as told by your health care provider. Exercise regularly as told by your health care provider. However, avoid exercising in the hours right before bedtime. Use relaxation techniques to manage stress. Ask your health care provider to suggest some techniques that may work well for you. These may include: Breathing exercises. Routines to release  muscle tension. Visualizing peaceful scenes. Make sure that you drive carefully. Do not drive if you feel very sleepy. Keep all follow-up visits. This is important. Contact a health care provider if: You are tired throughout the day. You have trouble in your daily routine due to sleepiness. You continue to have sleep problems, or your sleep problems get worse. Get help right away if: You have thoughts about hurting yourself or someone else. Get help right away if you feel like you may hurt yourself or others, or have thoughts about taking your own life. Go to your nearest emergency room or: Call 911. Call the National Suicide Prevention Lifeline at 832 474 5001 or 988. This is open 24 hours a day. Text the Crisis Text Line at 616-391-8646. Summary Insomnia is a sleep disorder that makes it difficult to fall asleep or stay asleep. Insomnia can be long-term (chronic) or short-term (acute). Treatment for insomnia depends on the cause. Treatment may focus on treating an underlying condition that is causing the insomnia. Keep a sleep diary to help you and your health care provider figure out what could be causing your insomnia. This information is not intended to replace advice given to you by your health care provider. Make sure you discuss any questions you have with your health care provider. Document Revised: 06/26/2021 Document Reviewed: 06/26/2021 Elsevier Patient Education  2024 Elsevier Inc.      Signed,   Meredith Staggers, MD [facility], [facility] 03/13/23 9:56 AM

## 2023-03-14 ENCOUNTER — Encounter: Payer: Self-pay | Admitting: Family Medicine

## 2023-03-21 ENCOUNTER — Ambulatory Visit (INDEPENDENT_AMBULATORY_CARE_PROVIDER_SITE_OTHER): Payer: Medicare HMO | Admitting: Podiatry

## 2023-03-21 ENCOUNTER — Ambulatory Visit (INDEPENDENT_AMBULATORY_CARE_PROVIDER_SITE_OTHER): Payer: Medicare HMO

## 2023-03-21 DIAGNOSIS — I739 Peripheral vascular disease, unspecified: Secondary | ICD-10-CM

## 2023-03-21 DIAGNOSIS — M778 Other enthesopathies, not elsewhere classified: Secondary | ICD-10-CM

## 2023-03-21 NOTE — Progress Notes (Signed)
  Subjective:  Patient ID: Jerry Rangel, male    DOB: 07/08/1951,  MRN: [id number]  Chief Complaint  Patient presents with   Foot Pain    Rm1: NP- Localized swelling of right foot, Pain of toe of right foot    72 y.o. male presents with the above complaint. History confirmed with patient.  He feels like it is starting to get better, he had been using an over-the-counter antifungal nail polish medication and was applying it and noticed this started around the same time.  Objective:  Physical Exam: no trophic changes or ulcerative lesions, normal sensory exam, and there is onychomycosis, some hyperpigmentation of the toes of the right foot there is no pain with range of motion of the toes or palpation, no edema, pulses are difficult to palpate.  Radiographs: Multiple views x-ray of the right foot: no fracture, dislocation, swelling or degenerative changes noted and calcified arteries noted into the forefoot Assessment:   1. Capsulitis of foot, right   2. Claudication of right lower extremity (HCC)      Plan:  Patient was evaluated and treated and all questions answered.  Discussed with him likely he was having intermittent capsulitis that has resolved, possible that he had a contact dermatitis that caused this as well from OTC antifungal medication.  Seems to be resolving at this point but I did recommend checking noninvasive vascular testing as his pulses are difficult to palpate and there are calcifications of his pedal arteries on his x-rays.  This has been ordered.  Follow-up with me as needed I will let him know what the results of his testing show if he requires referral to vascular surgery for further intervention.  Return if symptoms worsen or fail to improve.

## 2023-05-01 ENCOUNTER — Ambulatory Visit (HOSPITAL_COMMUNITY)
Admission: RE | Admit: 2023-05-01 | Discharge: 2023-05-01 | Disposition: A | Discharge status: Home / Self Care | Payer: Medicare HMO | Source: Ambulatory Visit | Attending: Cardiology | Admitting: Cardiology

## 2023-05-01 DIAGNOSIS — I739 Peripheral vascular disease, unspecified: Secondary | ICD-10-CM | POA: Diagnosis not present

## 2023-05-03 LAB — VAS US PAD ABI

## 2023-05-06 ENCOUNTER — Other Ambulatory Visit (HOSPITAL_COMMUNITY): Payer: Self-pay | Admitting: Podiatry

## 2023-05-06 DIAGNOSIS — I739 Peripheral vascular disease, unspecified: Secondary | ICD-10-CM

## 2023-05-23 ENCOUNTER — Encounter: Payer: Self-pay | Admitting: Vascular Surgery

## 2023-05-23 ENCOUNTER — Ambulatory Visit (INDEPENDENT_AMBULATORY_CARE_PROVIDER_SITE_OTHER): Payer: Medicare HMO | Admitting: Vascular Surgery

## 2023-05-23 VITALS — BP 152/81 | HR 65 | Temp 97.4°F | Ht 72.0 in | Wt 178.6 lb

## 2023-05-23 DIAGNOSIS — I739 Peripheral vascular disease, unspecified: Secondary | ICD-10-CM | POA: Diagnosis not present

## 2023-05-23 DIAGNOSIS — R6889 Other general symptoms and signs: Secondary | ICD-10-CM

## 2023-05-23 NOTE — Progress Notes (Signed)
Patient ID: Jerry Rangel, male   DOB: [birth year], 72 y.o.   MRN: [id number]  Reason for Consult: New Patient (Initial Visit)   Referred by Edwin Cap, DPM  Subjective:     HPI:  Jerry Rangel is a 72 y.o. male without history of vascular disease.  He does have diabetes, hyperlipidemia hypertension.  He takes an aspirin and a statin daily.  For the past 3 months he has noted some pain in his right foot both at rest and with walking.  He states that he can really walk as far as he wants without any claudication.  He does not have any tissue loss or ulceration.  He does have dark discoloration of the toes on the right foot but has never had any issues with wound healing.  Left leg is asymptomatic.  Denies any history of stroke, TIA or amaurosis and no personal or family history of aneurysm disease.  Past Medical History:  Diagnosis Date   Diabetes mellitus without complication (HCC)    Erectile dysfunction    Hyperlipidemia    Hypertension    Peripheral arterial disease (HCC)    Family History  Problem Relation Age of Onset   Heart disease Mother    Kidney disease Mother    Diabetes Mother    Hypertension Mother    Kidney disease Sister    Heart disease Sister    Depression Sister    Past Surgical History:  Procedure Laterality Date   KNEE SURGERY Right     Short Social History:  Social History   Tobacco Use   Smoking status: Never   Smokeless tobacco: Never  Substance Use Topics   Alcohol use: Yes    Alcohol/week: 2.0 standard drinks of alcohol    Types: 2 Glasses of wine per week    Allergies  Allergen Reactions   Ace Inhibitors Swelling    Current Outpatient Medications  Medication Sig Dispense Refill   aspirin 81 MG tablet Take 81 mg by mouth daily.     atenolol (TENORMIN) 50 MG tablet Take 1 tablet (50 mg total) by mouth daily. 90 tablet 1   hydrochlorothiazide (HYDRODIURIL) 25 MG tablet Take 1 tablet (25 mg total) by mouth daily. 90 tablet 1    metFORMIN (GLUCOPHAGE) 500 MG tablet Take 1 tablet (500 mg total) by mouth 2 (two) times daily with a meal. 180 tablet 1   NIFEdipine (PROCARDIA XL/NIFEDICAL-XL) 90 MG 24 hr tablet Take 1 tablet (90 mg total) by mouth daily. 90 tablet 1   ONETOUCH VERIO test strip as directed.     rosuvastatin (CRESTOR) 10 MG tablet Take 1 tablet (10 mg total) by mouth daily. 90 tablet 1   spironolactone (ALDACTONE) 25 MG tablet Take 1 tablet (25 mg total) by mouth daily. 90 tablet 1   tadalafil (CIALIS) 20 MG tablet Take 1 tablet (20 mg total) by mouth daily as needed for erectile dysfunction. 10 tablet 11   traZODone (DESYREL) 50 MG tablet Take 50-100 mg by mouth at bedtime as needed.     No current facility-administered medications for this visit.    Review of Systems  Constitutional:  Constitutional negative. HENT: HENT negative.  Eyes: Eyes negative.  Respiratory: Respiratory negative.  Cardiovascular: Cardiovascular negative.  GI: Gastrointestinal negative.  Musculoskeletal:       Right toe pain Skin:       Dark discoloration toes on right foot Neurological: Neurological negative. Psychiatric: Psychiatric negative.        Objective:  Objective   Vitals:   05/23/23 1053  BP: (!) 152/81  Pulse: 65  Temp: (!) 97.4 F (36.3 C)  TempSrc: Temporal  SpO2: 98%  Weight: 178 lb 9.6 oz (81 kg)  Height: 6' (1.829 m)   Body mass index is 24.22 kg/m.  Physical Exam HENT:     Head: Normocephalic.     Nose: Nose normal.  Eyes:     Pupils: Pupils are equal, round, and reactive to light.  Cardiovascular:     Pulses:          Femoral pulses are 2+ on the right side and 2+ on the left side.      Popliteal pulses are 0 on the right side and 2+ on the left side.       Dorsalis pedis pulses are 0 on the right side and 2+ on the left side.       Posterior tibial pulses are 0 on the right side.  Pulmonary:     Effort: Pulmonary effort is normal.  Abdominal:     General: Abdomen is flat.   Musculoskeletal:        General: Normal range of motion.     Right lower leg: No edema.     Left lower leg: No edema.  Skin:    Comments: The middle 3 toes on the right foot are darker than the other 2 toes and the toes on the left foot  Neurological:     Mental Status: He is alert.  Psychiatric:        Mood and Affect: Mood normal.        Thought Content: Thought content normal.        Judgment: Judgment normal.     Data: ABI Findings:  +---------+------------------+-----+-------------------+--------+  Right   Rt Pressure (mmHg)IndexWaveform           Comment   +---------+------------------+-----+-------------------+--------+  Brachial 160                                                 +---------+------------------+-----+-------------------+--------+  PTA     254               1.58 monophasic                   +---------+------------------+-----+-------------------+--------+  DP      254               1.58 dampened monophasic          +---------+------------------+-----+-------------------+--------+  Great Toe81                0.50 Abnormal                     +---------+------------------+-----+-------------------+--------+   +---------+------------------+-----+-----------+-------+  Left    Lt Pressure (mmHg)IndexWaveform   Comment  +---------+------------------+-----+-----------+-------+  Brachial 161                                        +---------+------------------+-----+-----------+-------+  PTA     235               1.46 multiphasic         +---------+------------------+-----+-----------+-------+  DP      237  1.47 multiphasic         +---------+------------------+-----+-----------+-------+  Great Toe222               1.38 Normal              +---------+------------------+-----+-----------+-------+   +-------+-----------------------+-----------+------------+------------+  ABI/TBIToday's  ABI            Today's TBIPrevious ABIPrevious TBI  +-------+-----------------------+-----------+------------+------------+  Right non-compressible       .50                                  +-------+-----------------------+-----------+------------+------------+  Left  non-compressible (1.47)1.38                                 +-------+-----------------------+-----------+------------+------------+          Assessment/Plan:     72 year old male with dark discoloration and pain of the toes of the right foot with preserved toe pressure of 81 and does have monophasic waveforms without a palpable popliteal pulse on the right relative to the left where he has a very strong palpable popliteal and dorsalis pedis pulse.  By physical exam patient likely has at the very least SFA stenosis on the right probable SFA occlusion.  Given his mild symptoms I will have him follow-up in 3 to 4 months to see how he is progressing.  At that time we will obtain duplex and ABIs.  Patient on aspirin and statin and I have recommended continued walking program.    Maeola Harman MD [facility]

## 2023-06-11 ENCOUNTER — Other Ambulatory Visit: Payer: Self-pay

## 2023-06-11 DIAGNOSIS — I739 Peripheral vascular disease, unspecified: Secondary | ICD-10-CM

## 2023-06-20 ENCOUNTER — Ambulatory Visit: Payer: Medicare HMO | Admitting: Family Medicine

## 2023-06-20 ENCOUNTER — Encounter: Payer: Self-pay | Admitting: Family Medicine

## 2023-06-20 VITALS — BP 132/78 | HR 68 | Temp 98.2°F | Ht 71.0 in | Wt 181.6 lb

## 2023-06-20 DIAGNOSIS — E1169 Type 2 diabetes mellitus with other specified complication: Secondary | ICD-10-CM

## 2023-06-20 DIAGNOSIS — Z23 Encounter for immunization: Secondary | ICD-10-CM

## 2023-06-20 DIAGNOSIS — I10 Essential (primary) hypertension: Secondary | ICD-10-CM

## 2023-06-20 DIAGNOSIS — L309 Dermatitis, unspecified: Secondary | ICD-10-CM | POA: Diagnosis not present

## 2023-06-20 DIAGNOSIS — E785 Hyperlipidemia, unspecified: Secondary | ICD-10-CM

## 2023-06-20 DIAGNOSIS — Z0000 Encounter for general adult medical examination without abnormal findings: Secondary | ICD-10-CM | POA: Diagnosis not present

## 2023-06-20 DIAGNOSIS — Z7984 Long term (current) use of oral hypoglycemic drugs: Secondary | ICD-10-CM

## 2023-06-20 DIAGNOSIS — Z1329 Encounter for screening for other suspected endocrine disorder: Secondary | ICD-10-CM

## 2023-06-20 LAB — COMPREHENSIVE METABOLIC PANEL
ALT: 36 U/L (ref 0–53)
AST: 28 U/L (ref 0–37)
Albumin: 4.6 g/dL (ref 3.5–5.2)
Alkaline Phosphatase: 47 U/L (ref 39–117)
BUN: 15 mg/dL (ref 6–23)
CO2: 28 meq/L (ref 19–32)
Calcium: 9.9 mg/dL (ref 8.4–10.5)
Chloride: 100 meq/L (ref 96–112)
Creatinine, Ser: 1.26 mg/dL (ref 0.40–1.50)
GFR: 57.11 mL/min — ABNORMAL LOW (ref >60.00)
Glucose, Bld: 146 mg/dL — ABNORMAL HIGH (ref 70–99)
Potassium: 3.5 meq/L (ref 3.5–5.1)
Sodium: 137 meq/L (ref 135–145)
Total Bilirubin: 0.6 mg/dL (ref 0.2–1.2)
Total Protein: 7.4 g/dL (ref 6.0–8.3)

## 2023-06-20 LAB — CBC WITH DIFFERENTIAL/PLATELET
Basophils Absolute: 0.1 K/uL (ref 0.0–0.1)
Basophils Relative: 0.9 % (ref 0.0–3.0)
Eosinophils Absolute: 0.3 K/uL (ref 0.0–0.7)
Eosinophils Relative: 4.8 % (ref 0.0–5.0)
HCT: 38.8 % — ABNORMAL LOW (ref 39.0–52.0)
Hemoglobin: 12.9 g/dL — ABNORMAL LOW (ref 13.0–17.0)
Lymphocytes Relative: 23.5 % (ref 12.0–46.0)
Lymphs Abs: 1.6 K/uL (ref 0.7–4.0)
MCHC: 33.1 g/dL (ref 30.0–36.0)
MCV: 81.5 fl (ref 78.0–100.0)
Monocytes Absolute: 0.6 K/uL (ref 0.1–1.0)
Monocytes Relative: 8.8 % (ref 3.0–12.0)
Neutro Abs: 4.3 K/uL (ref 1.4–7.7)
Neutrophils Relative %: 62 % (ref 43.0–77.0)
Platelets: 147 K/uL — ABNORMAL LOW (ref 150.0–400.0)
RBC: 4.77 Mil/uL (ref 4.22–5.81)
RDW: 15.7 % — ABNORMAL HIGH (ref 11.5–15.5)
WBC: 7 K/uL (ref 4.0–10.5)

## 2023-06-20 LAB — MICROALBUMIN / CREATININE URINE RATIO
Creatinine,U: 90.6 mg/dL
Microalb Creat Ratio: 2.4 mg/g (ref 0.0–30.0)
Microalb, Ur: 2.2 mg/dL — ABNORMAL HIGH (ref 0.0–1.9)

## 2023-06-20 LAB — TSH: TSH: 0.71 u[IU]/mL (ref 0.35–5.50)

## 2023-06-20 LAB — HEMOGLOBIN A1C: Hgb A1c MFr Bld: 6.6 % — ABNORMAL HIGH (ref 4.6–6.5)

## 2023-06-20 LAB — LIPID PANEL
Cholesterol: 129 mg/dL (ref 0–200)
HDL: 34 mg/dL — ABNORMAL LOW
LDL Cholesterol: 71 mg/dL (ref 0–99)
NonHDL: 94.7
Total CHOL/HDL Ratio: 4
Triglycerides: 120 mg/dL (ref 0.0–149.0)
VLDL: 24 mg/dL (ref 0.0–40.0)

## 2023-06-20 MED ORDER — TRIAMCINOLONE ACETONIDE 0.1 % EX CREA: 1.0000 | TOPICAL_CREAM | Freq: Two times a day (BID) | CUTANEOUS | 0 refills | Status: DC

## 2023-06-20 NOTE — Patient Instructions (Addendum)
Thanks for coming in today.  See information below for your physical.  No medication changes at this time other than steroid cream for the rash on the ankle which appears to be a type of eczema.  Can also use hydrating lotion like Eucerin up to twice per day.  Follow-up if that rash is not improving in the next few weeks or sooner if worse.  Keep follow-up with specialist as planned including vascular specialist.  Let me know if there are questions.  Preventive Care 71 Years and Older, Male Preventive care refers to lifestyle choices and visits with your health care provider that can promote health and wellness. Preventive care visits are also called wellness exams. What can I expect for my preventive care visit? Counseling During your preventive care visit, your health care provider may ask about your: Medical history, including: Past medical problems. Family medical history. History of falls. Current health, including: Emotional well-being. Home life and relationship well-being. Sexual activity. Memory and ability to understand (cognition). Lifestyle, including: Alcohol, nicotine or tobacco, and drug use. Access to firearms. Diet, exercise, and sleep habits. Work and work Astronomer. Sunscreen use. Safety issues such as seatbelt and bike helmet use. Physical exam Your health care provider will check your: Height and weight. These may be used to calculate your BMI (body mass index). BMI is a measurement that tells if you are at a healthy weight. Waist circumference. This measures the distance around your waistline. This measurement also tells if you are at a healthy weight and may help predict your risk of certain diseases, such as type 2 diabetes and high blood pressure. Heart rate and blood pressure. Body temperature. Skin for abnormal spots. What immunizations do I need?  Vaccines are usually given at various ages, according to a schedule. Your health care provider will recommend  vaccines for you based on your age, medical history, and lifestyle or other factors, such as travel or where you work. What tests do I need? Screening Your health care provider may recommend screening tests for certain conditions. This may include: Lipid and cholesterol levels. Diabetes screening. This is done by checking your blood sugar (glucose) after you have not eaten for a while (fasting). Hepatitis C test. Hepatitis B test. HIV (human immunodeficiency virus) test. STI (sexually transmitted infection) testing, if you are at risk. Lung cancer screening. Colorectal cancer screening. Prostate cancer screening. Abdominal aortic aneurysm (AAA) screening. You may need this if you are a current or former smoker. Talk with your health care provider about your test results, treatment options, and if necessary, the need for more tests. Follow these instructions at home: Eating and drinking  Eat a diet that includes fresh fruits and vegetables, whole grains, lean protein, and low-fat dairy products. Limit your intake of foods with high amounts of sugar, saturated fats, and salt. Take vitamin and mineral supplements as recommended by your health care provider. Do not drink alcohol if your health care provider tells you not to drink. If you drink alcohol: Limit how much you have to 0-2 drinks a day. Know how much alcohol is in your drink. In the [place] one drink equals one 12 oz bottle of beer (355 mL), one 5 oz glass of wine (148 mL), or one 1 oz glass of hard liquor (44 mL). Lifestyle Brush your teeth every morning and night with fluoride toothpaste. Floss one time each day. Exercise for at least 30 minutes 5 or more days each week. Do not use any products that  contain nicotine or tobacco. These products include cigarettes, chewing tobacco, and vaping devices, such as e-cigarettes. If you need help quitting, ask your health care provider. Do not use drugs. If you are sexually active, practice  safe sex. Use a condom or other form of protection to prevent STIs. Take aspirin only as told by your health care provider. Make sure that you understand how much to take and what form to take. Work with your health care provider to find out whether it is safe and beneficial for you to take aspirin daily. Ask your health care provider if you need to take a cholesterol-lowering medicine (statin). Find healthy ways to manage stress, such as: Meditation, yoga, or listening to music. Journaling. Talking to a trusted person. Spending time with friends and family. Safety Always wear your seat belt while driving or riding in a vehicle. Do not drive: If you have been drinking alcohol. Do not ride with someone who has been drinking. When you are tired or distracted. While texting. If you have been using any mind-altering substances or drugs. Wear a helmet and other protective equipment during sports activities. If you have firearms in your house, make sure you follow all gun safety procedures. Minimize exposure to UV radiation to reduce your risk of skin cancer. What's next? Visit your health care provider once a year for an annual wellness visit. Ask your health care provider how often you should have your eyes and teeth checked. Stay up to date on all vaccines. This information is not intended to replace advice given to you by your health care provider. Make sure you discuss any questions you have with your health care provider. Document Revised: 01/11/2021 Document Reviewed: 01/11/2021 Elsevier Patient Education  2024 [employer].

## 2023-06-20 NOTE — Progress Notes (Signed)
Subjective:  Patient ID: Jerry Rangel, male    DOB: 1951/07/28  Age: 72 y.o. MRN: [id number]  CC:  Chief Complaint  Patient presents with   Annual Exam    Pt notes a sore spot on his Rt ankle that has been bothering him for about 2 weeks, notes it is itchy otherwise okay. Pt is fasting     HPI [name] presents for Annual Exam. No health changes, jsut eval by vascular below  PCP, me Podiatry, Dr. Lilian Kapur. Urology, Dr. Pete Glatter, nocturia, impotence.  Treated with   Vascular, Dr. Randie Heinz, peripheral arterial disease.  Office visit October 24.  Suspected SFA stenosis.  Mild symptoms, plan for 3 to 40-month follow-up with duplex and ABIs.  Continued walking program, aspirin and statin recommended.  Right ankle itching: Past few weeks. - dark area/rash on outside of R ankle. Unknown cause. Itches, no pain. No wound, no d/c. No fever, or spreading redness.  Tx: vaseline.   Diabetes: With microalbuminuria.  Treated with metformin 500 mg twice daily, walking for exercise, avoiding ACE inhibitors with history of angioedema.  He is on statin. Home readings fasting:none Postprandial: 135, 140 Symptomatic lows - none.  Microalbumin: Normal ratio 06/14/2022 Optho, foot exam, pneumovax: Up-to-date  Lab Results  Component Value Date   HGBA1C 6.6 (H) 12/13/2022   HGBA1C 6.6 (H) 06/14/2022   HGBA1C 6.0 12/06/2021   Lab Results  Component Value Date   MICROALBUR 1.7 06/14/2022   LDLCALC 61 12/13/2022   CREATININE 1.32 12/13/2022    Hypertension: Through his nifedipine, atenolol, spironolactone, HCTZ. On same regimen for some time. No cardiologist. No side effects.  Home readings: BP Readings from Last 3 Encounters:  06/20/23 132/78  05/23/23 (!) 152/81  03/13/23 136/74   Lab Results  Component Value Date   CREATININE 1.32 12/13/2022   Hyperlipidemia: Crestor 10 mg every day, no myalgias or side effects.  Lab Results  Component Value Date   CHOL 125 12/13/2022   HDL  35.90 (L) 12/13/2022   LDLCALC 61 12/13/2022   TRIG 138.0 12/13/2022   CHOLHDL 3 12/13/2022   Lab Results  Component Value Date   ALT 44 12/13/2022   AST 33 12/13/2022   ALKPHOS 45 12/13/2022   BILITOT 0.3 12/13/2022           06/20/2023    9:21 AM 03/13/2023    9:43 AM 12/26/2022   11:09 AM 12/13/2022    9:26 AM 06/14/2022    8:51 AM  Depression screen PHQ 2/9  Decreased Interest 0 0 0 0 0  Down, Depressed, Hopeless 0 0 0 0 0  PHQ - 2 Score 0 0 0 0 0  Altered sleeping 0 1 1 0 3  Tired, decreased energy 0 0 1 0 0  Change in appetite 0 0 0 0 0  Feeling bad or failure about yourself  0 0 0 0 0  Trouble concentrating 0 0 0 0 0  Moving slowly or fidgety/restless 0 0 0 0 0  Suicidal thoughts 0 0 0 0 0  PHQ-9 Score 0 1 2 0 3  Difficult doing work/chores   Not difficult at all Not difficult at all     Health Maintenance  Topic Date Due   INFLUENZA VACCINE  02/28/2023   Zoster Vaccines- Shingrix (2 of 2) 03/14/2023   COVID-19 Vaccine (4 - 2023-24 season) 03/31/2023   Diabetic kidney evaluation - Urine ACR  06/15/2023   Diabetic kidney evaluation - eGFR measurement  12/13/2023  Medicare Annual Wellness (AWV)  12/26/2023   Fecal DNA (Cologuard)  01/21/2026   DTaP/Tdap/Td (3 - Td or Tdap) 02/12/2033   Pneumonia Vaccine 70+ Years old  Completed   Hepatitis C Screening  Completed   HPV VACCINES  Aged Out   Colonoscopy  Discontinued  Negative Cologuard 01/22/23.  Prostate: does not have family history of prostate cancer.  Has urologist. Saw 4-5 months ago - no recent bloodwork.  The natural history of prostate cancer and ongoing controversy regarding screening and potential treatment outcomes of prostate cancer has been discussed with the patient. Given age, deferred testing at this time and can discuss with his urologist if needed. Lab Results  Component Value Date   PSA 1.00 06/14/2022   PSA 0.79 10/07/2012     Immunization History  Administered Date(s) Administered    DTaP 12/31/2006   Fluad Quad(high Dose 65+) 06/01/2021, 06/14/2022   Influenza, Quadrivalent, Recombinant, Inj, Pf 05/15/2018, 05/13/2019   Influenza-Unspecified 04/29/2018   Moderna Sars-Covid-2 Vaccination 02/22/2020, 03/21/2020, 01/03/2021   PNEUMOCOCCAL CONJUGATE-20 02/13/2023   Pneumococcal Conjugate-13 11/07/2017   Pneumococcal Polysaccharide-23 09/04/2021   Pneumococcal-Unspecified 02/13/2023   Tdap 02/13/2023   Zoster Recombinant(Shingrix) 01/17/2023  Flu vaccine - today.  Shingrix - recommended repeat at his pharmacy.  Recommended covid booster at pharmacy.  Option of RSV discussed at pharmacy.   No results found. Wears glasses. Optho about 7 months ago.   Dental:Within Last 6 months  Alcohol: 1 per week.   Tobacco: none.   Exercise: mall walking every other day, . No Cp/dyspnea with exercise.    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 Authorizing Provider  aspirin 81 MG tablet Take 81 mg by mouth daily.   Yes [provider]  atenolol (TENORMIN) 50 MG tablet Take 1 tablet (50 mg total) by mouth daily. 12/13/22  Yes Shade Flood, MD  hydrochlorothiazide (HYDRODIURIL) 25 MG tablet Take 1 tablet (25 mg total) by mouth daily. 12/13/22  Yes Shade Flood, MD  metFORMIN (GLUCOPHAGE) 500 MG tablet Take 1 tablet (500 mg total) by mouth 2 (two) times daily with a meal. 12/13/22  Yes Shade Flood, MD  NIFEdipine (PROCARDIA XL/NIFEDICAL-XL) 90 MG 24 hr tablet Take 1 tablet (90 mg total) by mouth daily. 12/13/22  Yes Shade Flood, MD  [facility] VERIO test strip as directed. 09/07/20  Yes [provider]  rosuvastatin (CRESTOR) 10 MG tablet Take 1 tablet (10 mg total) by mouth daily. 12/13/22  Yes Shade Flood, MD  sildenafil (VIAGRA) 100 MG tablet Take 100 mg by mouth as needed. 08/10/13  Yes [provider]  spironolactone (ALDACTONE) 25 MG tablet Take 1 tablet (25 mg total) by mouth daily. 12/13/22  Yes Shade Flood, MD  tadalafil (CIALIS) 20 MG tablet Take 1 tablet (20 mg total) by mouth daily as needed for erectile dysfunction. 10/17/22  Yes Stoneking, Danford Bad., MD  traZODone (DESYREL) 50 MG tablet Take 50-100 mg by mouth at bedtime as needed. 12/19/20  Yes [provider]   Social History   Socioeconomic History   Marital status: Married    Spouse name: Not on file   Number of children: Not on file   Years of education: Not on file   Highest education level: Not on file  Occupational History   Not on file  Tobacco Use   Smoking status: Never   Smokeless tobacco: Never  Vaping Use   Vaping status: Never Used  Substance and Sexual Activity   Alcohol use: Yes    Alcohol/week: 2.0 standard drinks of alcohol    Types: 2 Glasses of wine per week   Drug use: No   Sexual activity: Yes    Birth control/protection: None  Other Topics Concern   Not on file  Social History Narrative   Not on file   Social Determinants of Health   Financial Resource Strain: Low Risk  (12/26/2022)   Overall Financial Resource Strain (CARDIA)    Difficulty of Paying Living Expenses: Not hard at all  Food Insecurity: No Food Insecurity (12/26/2022)   Hunger Vital Sign    Worried About Running Out of Food in the Last Year: Never true    Ran Out of Food in the Last Year: Never true  Transportation Needs: No Transportation Needs (12/26/2022)   PRAPARE -  Administrator, Civil Service (Medical): No    Lack of Transportation (Non-Medical): No  Physical Activity: Sufficiently Active (12/26/2022)   Exercise Vital Sign    Days of Exercise per Week: 4 days    Minutes of Exercise per Session: 40 min  Stress: No Stress Concern Present (12/26/2022)   [employer] of Occupational Health - Occupational Stress Questionnaire    Feeling of Stress : Not at all  Social Connections: Moderately Integrated (12/26/2022)   Social Connection and Isolation Panel [NHANES]    Frequency of Communication with Friends and Family: Twice a week    Frequency of Social Gatherings with Friends and Family: Once a week    Attends Religious Services: More than 4 times per year    Active Member of [employer] or Organizations: No    Attends Banker Meetings: Never    Marital Status: Married  Catering manager Violence: Not At Risk (12/26/2022)   Humiliation, Afraid, Rape, and Kick questionnaire    Fear of Current or Ex-Partner: No    Emotionally Abused: No    Physically Abused: No    Sexually Abused: No    Review of Systems  13 point review of systems per patient health survey noted.  Negative other than as indicated above or in HPI.   Objective:   Vitals:   06/20/23 0921  BP: 132/78  Pulse: 68  Temp: 98.2 F (36.8 C)  TempSrc: Temporal  SpO2: 100%  Weight: 181 lb 9.6 oz (82.4 kg)  Height: 5\' 11"  (1.803 m)     Physical Exam Vitals reviewed.  Constitutional:      Appearance: He is well-developed.  HENT:     Head: Normocephalic and atraumatic.     Right Ear: External ear normal.     Left Ear: External ear normal.  Eyes:     Conjunctiva/sclera: Conjunctivae normal.     Pupils: Pupils are equal, round, and reactive to light.  Neck:     Thyroid: No thyromegaly.  Cardiovascular:     Rate and Rhythm: Normal rate and  regular rhythm.     Heart sounds: Normal heart sounds.  Pulmonary:     Effort: Pulmonary effort is normal. No  respiratory distress.     Breath sounds: Normal breath sounds. No wheezing.  Abdominal:     General: There is no distension.     Palpations: Abdomen is soft.     Tenderness: There is no abdominal tenderness.  Musculoskeletal:        General: No tenderness. Normal range of motion.     Cervical back: Normal range of motion and neck supple.  Lymphadenopathy:     Cervical: No cervical adenopathy.  Skin:    General: Skin is warm and dry.     Comments: Right ankle lateral aspect with hyperpigmented patch, slight excoriation, see photo.  No surrounding erythema, no wounds.  No discharge.  No significant duration.  Neurological:     Mental Status: He is alert and oriented to person, place, and time.     Deep Tendon Reflexes: Reflexes are normal and symmetric.  Psychiatric:        Behavior: Behavior normal.         Assessment & Plan:  Emzie Gokey is a 72 y.o. male . Annual physical exam - Plan: Microalbumin / creatinine urine ratio, Comprehensive metabolic panel, Hemoglobin A1c, Lipid panel, CBC with Differential/Platelet, TSH  - -anticipatory guidance as below in AVS, screening labs above. Health maintenance items as above in HPI discussed/recommended as applicable.   Hyperlipidemia, unspecified hyperlipidemia type - Plan: Comprehensive metabolic panel, Lipid panel  -  Stable, tolerating current regimen.  Check labs and adjust plan accordingly  Type 2 diabetes mellitus with hyperlipidemia (HCC) - Plan: Microalbumin / creatinine urine ratio, Comprehensive metabolic panel, Hemoglobin A1c  -Tolerating current med regimen with metformin, continue same and check labs with adjustment of plan accordingly  Essential hypertension - Plan: CBC with Differential/Platelet, TSH  -Stable with current regimen, check electrolytes, will continue same meds including HCTZ and spironolactone, but if numbers improve consider titrating off one of the diuretics.  Keep follow-up with vascular regarding  PAD.  Need for influenza vaccination - Plan: Flu Vaccine Trivalent High Dose (Fluad)  Screening for thyroid disorder - Plan: TSH  Eczema, unspecified type - Plan: triamcinolone cream (KENALOG) 0.1 % Eczematous appearing rash of ankle.  Triamcinolone with Eucerin cream, RTC precautions.  No sign of ulceration, unlikely vascular cause but RTC precautions given.  Meds ordered this encounter  Medications   triamcinolone cream (KENALOG) 0.1 %    Sig: Apply 1 Application topically 2 (two) times daily.    Dispense:  30 g    Refill:  0   Patient Instructions  Thanks for coming in today.  See information below for your physical.  No medication changes at this time other than steroid cream for the rash on the ankle which appears to be a type of eczema.  Can also use hydrating lotion like Eucerin up to twice per day.  Follow-up if that rash is not improving in the next few weeks or sooner if worse.  Keep follow-up with specialist as planned including vascular specialist.  Let me know if there are questions.  Preventive Care 49 Years and Older, Male Preventive care refers to lifestyle choices and visits with your health care provider that can promote health and wellness. Preventive care visits are also called wellness exams. What can I expect for my preventive care visit? Counseling During your preventive care visit, your health care provider may ask about your: Medical  history, including: Past medical problems. Family medical history. History of falls. Current health, including: Emotional well-being. Home life and relationship well-being. Sexual activity. Memory and ability to understand (cognition). Lifestyle, including: Alcohol, nicotine or tobacco, and drug use. Access to firearms. Diet, exercise, and sleep habits. Work and work Astronomer. Sunscreen use. Safety issues such as seatbelt and bike helmet use. Physical exam Your health care provider will check your: Height and weight.  These may be used to calculate your BMI (body mass index). BMI is a measurement that tells if you are at a healthy weight. Waist circumference. This measures the distance around your waistline. This measurement also tells if you are at a healthy weight and may help predict your risk of certain diseases, such as type 2 diabetes and high blood pressure. Heart rate and blood pressure. Body temperature. Skin for abnormal spots. What immunizations do I need?  Vaccines are usually given at various ages, according to a schedule. Your health care provider will recommend vaccines for you based on your age, medical history, and lifestyle or other factors, such as travel or where you work. What tests do I need? Screening Your health care provider may recommend screening tests for certain conditions. This may include: Lipid and cholesterol levels. Diabetes screening. This is done by checking your blood sugar (glucose) after you have not eaten for a while (fasting). Hepatitis C test. Hepatitis B test. HIV (human immunodeficiency virus) test. STI (sexually transmitted infection) testing, if you are at risk. Lung cancer screening. Colorectal cancer screening. Prostate cancer screening. Abdominal aortic aneurysm (AAA) screening. You may need this if you are a current or former smoker. Talk with your health care provider about your test results, treatment options, and if necessary, the need for more tests. Follow these instructions at home: Eating and drinking  Eat a diet that includes fresh fruits and vegetables, whole grains, lean protein, and low-fat dairy products. Limit your intake of foods with high amounts of sugar, saturated fats, and salt. Take vitamin and mineral supplements as recommended by your health care provider. Do not drink alcohol if your health care provider tells you not to drink. If you drink alcohol: Limit how much you have to 0-2 drinks a day. Know how much alcohol is in your  drink. In the [place] one drink equals one 12 oz bottle of beer (355 mL), one 5 oz glass of wine (148 mL), or one 1 oz glass of hard liquor (44 mL). Lifestyle Brush your teeth every morning and night with fluoride toothpaste. Floss one time each day. Exercise for at least 30 minutes 5 or more days each week. Do not use any products that contain nicotine or tobacco. These products include cigarettes, chewing tobacco, and vaping devices, such as e-cigarettes. If you need help quitting, ask your health care provider. Do not use drugs. If you are sexually active, practice safe sex. Use a condom or other form of protection to prevent STIs. Take aspirin only as told by your health care provider. Make sure that you understand how much to take and what form to take. Work with your health care provider to find out whether it is safe and beneficial for you to take aspirin daily. Ask your health care provider if you need to take a cholesterol-lowering medicine (statin). Find healthy ways to manage stress, such as: Meditation, yoga, or listening to music. Journaling. Talking to a trusted person. Spending time with friends and family. Safety Always wear your seat belt while driving or  riding in a vehicle. Do not drive: If you have been drinking alcohol. Do not ride with someone who has been drinking. When you are tired or distracted. While texting. If you have been using any mind-altering substances or drugs. Wear a helmet and other protective equipment during sports activities. If you have firearms in your house, make sure you follow all gun safety procedures. Minimize exposure to UV radiation to reduce your risk of skin cancer. What's next? Visit your health care provider once a year for an annual wellness visit. Ask your health care provider how often you should have your eyes and teeth checked. Stay up to date on all vaccines. This information is not intended to replace advice given to you by your  health care provider. Make sure you discuss any questions you have with your health care provider. Document Revised: 01/11/2021 Document Reviewed: 01/11/2021 Elsevier Patient Education  2024 Elsevier Inc.     Signed,   Meredith Staggers, MD [facility], [facility] 06/20/23 10:00 AM

## 2023-06-21 ENCOUNTER — Other Ambulatory Visit: Payer: Self-pay | Admitting: Family Medicine

## 2023-06-21 DIAGNOSIS — I10 Essential (primary) hypertension: Secondary | ICD-10-CM

## 2023-07-31 ENCOUNTER — Other Ambulatory Visit: Payer: Self-pay | Admitting: Family Medicine

## 2023-07-31 DIAGNOSIS — I10 Essential (primary) hypertension: Secondary | ICD-10-CM

## 2023-08-14 ENCOUNTER — Other Ambulatory Visit: Payer: Self-pay | Admitting: Family Medicine

## 2023-08-14 DIAGNOSIS — E785 Hyperlipidemia, unspecified: Secondary | ICD-10-CM

## 2023-08-28 ENCOUNTER — Ambulatory Visit (HOSPITAL_COMMUNITY)
Admission: RE | Admit: 2023-08-28 | Discharge: 2023-08-28 | Disposition: A | Discharge status: Home / Self Care | Payer: Medicare PPO | Source: Ambulatory Visit | Attending: Vascular Surgery | Admitting: Vascular Surgery

## 2023-08-28 ENCOUNTER — Ambulatory Visit: Payer: Medicare PPO | Admitting: Vascular Surgery

## 2023-08-28 ENCOUNTER — Ambulatory Visit (INDEPENDENT_AMBULATORY_CARE_PROVIDER_SITE_OTHER)
Admission: RE | Admit: 2023-08-28 | Discharge: 2023-08-28 | Disposition: A | Discharge status: Home / Self Care | Payer: Medicare PPO | Source: Ambulatory Visit | Attending: Vascular Surgery

## 2023-08-28 ENCOUNTER — Encounter: Payer: Self-pay | Admitting: Vascular Surgery

## 2023-08-28 VITALS — BP 141/73 | HR 72 | Temp 97.9°F | Resp 20 | Ht 71.0 in | Wt 178.8 lb

## 2023-08-28 DIAGNOSIS — I739 Peripheral vascular disease, unspecified: Secondary | ICD-10-CM | POA: Diagnosis not present

## 2023-08-28 LAB — VAS US ABI WITH/WO TBI: Right ABI: 1.27

## 2023-08-28 NOTE — Progress Notes (Signed)
Patient ID: Jerry Rangel, male   DOB: [birth year], 73 y.o.   MRN: [id number]  Reason for Consult: Follow-up   Referred by Shade Flood, MD  Subjective:     HPI:  Jerry Rangel is a 73 y.o. male without history of vascular disease with diabetes, hyperlipidemia and hypertension.  He continues on aspirin and statin.  At last visit was evaluated for right toe pain and was noted to have pain with walking but did not have any frank claudication symptoms or tissue loss or ulceration.  He is now here for follow-up with right lower extremity arterial duplex and has no new medical issues.  Past Medical History:  Diagnosis Date   Diabetes mellitus without complication (HCC)    Erectile dysfunction    Hyperlipidemia    Hypertension    Peripheral arterial disease (HCC)    Family History  Problem Relation Age of Onset   Heart disease Mother    Kidney disease Mother    Diabetes Mother    Hypertension Mother    Kidney disease Sister    Heart disease Sister    Depression Sister    Past Surgical History:  Procedure Laterality Date   KNEE SURGERY Right     Short Social History:  Social History   Tobacco Use   Smoking status: Never   Smokeless tobacco: Never  Substance Use Topics   Alcohol use: Yes    Alcohol/week: 2.0 standard drinks of alcohol    Types: 2 Glasses of wine per week    Allergies  Allergen Reactions   Ace Inhibitors Swelling    Current Outpatient Medications  Medication Sig Dispense Refill   aspirin 81 MG tablet Take 81 mg by mouth daily.     atenolol (TENORMIN) 50 MG tablet TAKE 1 TABLET BY MOUTH EVERY DAY 90 tablet 1   hydrochlorothiazide (HYDRODIURIL) 25 MG tablet Take 1 tablet (25 mg total) by mouth daily. 90 tablet 1   metFORMIN (GLUCOPHAGE) 500 MG tablet Take 1 tablet (500 mg total) by mouth 2 (two) times daily with a meal. 180 tablet 1   NIFEdipine (PROCARDIA XL/NIFEDICAL-XL) 90 MG 24 hr tablet TAKE 1 TABLET BY MOUTH EVERY DAY 90 tablet 1    ONETOUCH VERIO test strip as directed.     rosuvastatin (CRESTOR) 10 MG tablet TAKE 1 TABLET BY MOUTH EVERY DAY 90 tablet 1   sildenafil (VIAGRA) 100 MG tablet Take 100 mg by mouth as needed.     spironolactone (ALDACTONE) 25 MG tablet TAKE 1 TABLET (25 MG TOTAL) BY MOUTH DAILY. 90 tablet 1   tadalafil (CIALIS) 20 MG tablet Take 1 tablet (20 mg total) by mouth daily as needed for erectile dysfunction. 10 tablet 11   traZODone (DESYREL) 50 MG tablet Take 50-100 mg by mouth at bedtime as needed.     triamcinolone cream (KENALOG) 0.1 % Apply 1 Application topically 2 (two) times daily. 30 g 0   No current facility-administered medications for this visit.    Review of Systems  Constitutional:  Constitutional negative. HENT: HENT negative.  Eyes: Eyes negative.  Respiratory: Respiratory negative.  Cardiovascular: Cardiovascular negative.  GI: Gastrointestinal negative.  Musculoskeletal: Musculoskeletal negative.  Skin: Skin negative.  Neurological: Neurological negative. Hematologic: Hematologic/lymphatic negative.  Psychiatric: Psychiatric negative.        Objective:  Objective   Vitals:   08/28/23 1130  BP: (!) 141/73  Pulse: 72  Resp: 20  Temp: 97.9 F (36.6 C)  SpO2: 98%  Weight: 178 lb  12.8 oz (81.1 kg)  Height: 5\' 11"  (1.803 m)   Body mass index is 24.94 kg/m.  Physical Exam HENT:     Head: Normocephalic.     Nose: Nose normal.  Eyes:     Pupils: Pupils are equal, round, and reactive to light.  Cardiovascular:     Pulses:          Femoral pulses are 2+ on the right side and 2+ on the left side.      Popliteal pulses are 2+ on the right side and 2+ on the left side.       Dorsalis pedis pulses are 2+ on the right side and 2+ on the left side.  Pulmonary:     Effort: Pulmonary effort is normal.  Abdominal:     General: Abdomen is flat.     Palpations: Abdomen is soft. There is no mass.  Musculoskeletal:     Right lower leg: No edema.     Left lower leg: No  edema.  Skin:    General: Skin is warm.     Capillary Refill: Capillary refill takes less than 2 seconds.     Comments: Skin of his toes is all normal color now bilaterally  Neurological:     General: No focal deficit present.     Mental Status: He is alert. Mental status is at baseline.  Psychiatric:        Mood and Affect: Mood normal.     Data: RIGHT      PSV cm/sRatioStenosisWaveform Comments  +-----------+--------+-----+--------+---------+--------+  CFA Mid    119                  triphasic          +-----------+--------+-----+--------+---------+--------+  DFA       99                   triphasic          +-----------+--------+-----+--------+---------+--------+  SFA Prox   103                  triphasic          +-----------+--------+-----+--------+---------+--------+  SFA Mid    103                  triphasic          +-----------+--------+-----+--------+---------+--------+  SFA Distal 71                   triphasic          +-----------+--------+-----+--------+---------+--------+  POP Prox   89                   triphasic          +-----------+--------+-----+--------+---------+--------+  POP Distal 55                   triphasic          +-----------+--------+-----+--------+---------+--------+  ATA Distal 52                   biphasic           +-----------+--------+-----+--------+---------+--------+  PTA Distal 60                   triphasic          +-----------+--------+-----+--------+---------+--------+  PERO Distal47                   biphasic           +-----------+--------+-----+--------+---------+--------+  Summary:  Right: Patent lower extremity without evidence of stenosis.   ABI Findings:  +---------+------------------+-----+---------+--------+  Right   Rt Pressure (mmHg)IndexWaveform Comment   +---------+------------------+-----+---------+--------+  Brachial 130                                        +---------+------------------+-----+---------+--------+  PTA     171               1.23 biphasic           +---------+------------------+-----+---------+--------+  DP      176               1.27 triphasic          +---------+------------------+-----+---------+--------+  Great Toe124               0.89                    +---------+------------------+-----+---------+--------+   +---------+------------------+-----+---------+-------+  Left    Lt Pressure (mmHg)IndexWaveform Comment  +---------+------------------+-----+---------+-------+  Brachial 139                                      +---------+------------------+-----+---------+-------+  PTA     148               1.06 biphasic          +---------+------------------+-----+---------+-------+  DP      255               1.83 triphasic         +---------+------------------+-----+---------+-------+  Great Toe202               1.45                   +---------+------------------+-----+---------+-------+   +-------+-----------+-----------+------------+------------+  ABI/TBIToday's ABIToday's TBIPrevious ABIPrevious TBI  +-------+-----------+-----------+------------+------------+  Right 1.27       0.89       [place]          0.50          +-------+-----------+-----------+------------+------------+  Left  [place]         1.45       [place]          1.38          +-------+-----------+-----------+------------+------------+         Summary:  Right: Resting right ankle-brachial index is within normal range. The  right toe-brachial index is normal.   Left: Resting left ankle-brachial index indicates noncompressible left  lower extremity arteries. The left toe-brachial index is normal.      Assessment/Plan:     73 year old male initially evaluated for right lower extremity toe pain without symptoms of claudication now follows up with arterial  duplex on the right and ABIs are normal with preserved toe pressures.  As such I recommended continued walking and he can see me on an as-needed basis.    Maeola Harman MD [facility]

## 2023-09-01 ENCOUNTER — Other Ambulatory Visit: Payer: Self-pay | Admitting: Family Medicine

## 2023-09-01 DIAGNOSIS — E1169 Type 2 diabetes mellitus with other specified complication: Secondary | ICD-10-CM

## 2023-09-02 ENCOUNTER — Other Ambulatory Visit: Payer: Self-pay | Admitting: Family Medicine

## 2023-09-02 DIAGNOSIS — I10 Essential (primary) hypertension: Secondary | ICD-10-CM

## 2023-12-01 ENCOUNTER — Other Ambulatory Visit: Payer: Self-pay | Admitting: Family Medicine

## 2023-12-01 DIAGNOSIS — I10 Essential (primary) hypertension: Secondary | ICD-10-CM

## 2023-12-03 ENCOUNTER — Ambulatory Visit (INDEPENDENT_AMBULATORY_CARE_PROVIDER_SITE_OTHER): Admitting: *Deleted

## 2023-12-03 DIAGNOSIS — Z0000 Encounter for general adult medical examination without abnormal findings: Secondary | ICD-10-CM

## 2023-12-03 NOTE — Patient Instructions (Signed)
 Jerry Rangel , Thank you for taking time to come for your Medicare Wellness Visit. I appreciate your ongoing commitment to your health goals. Please review the following plan we discussed and let me know if I can assist you in the future.   Screening recommendations/referrals: Colonoscopy: up to date Recommended yearly ophthalmology/optometry visit for glaucoma screening and checkup Recommended yearly dental visit for hygiene and checkup  Vaccinations: Influenza vaccine: up to date Pneumococcal vaccine: up to date Tdap vaccine: up to date Shingles vaccine: up to date      Preventive Care 73 Years and Older, Male Preventive care refers to lifestyle choices and visits with your health care provider that can promote health and wellness. What does preventive care include? A yearly physical exam. This is also called an annual well check. Dental exams once or twice a year. Routine eye exams. Ask your health care provider how often you should have your eyes checked. Personal lifestyle choices, including: Daily care of your teeth and gums. Regular physical activity. Eating a healthy diet. Avoiding tobacco and drug use. Limiting alcohol use. Practicing safe sex. Taking low doses of aspirin every day. Taking vitamin and mineral supplements as recommended by your health care provider. What happens during an annual well check? The services and screenings done by your health care provider during your annual well check will depend on your age, overall health, lifestyle risk factors, and family history of disease. Counseling  Your health care provider may ask you questions about your: Alcohol use. Tobacco use. Drug use. Emotional well-being. Home and relationship well-being. Sexual activity. Eating habits. History of falls. Memory and ability to understand (cognition). Work and work Astronomer. Screening  You may have the following tests or measurements: Height, weight, and BMI. Blood  pressure. Lipid and cholesterol levels. These may be checked every 5 years, or more frequently if you are over 73 years old. Skin check. Lung cancer screening. You may have this screening every year starting at age 73 if you have a 30-pack-year history of smoking and currently smoke or have quit within the past 15 years. Fecal occult blood test (FOBT) of the stool. You may have this test every year starting at age 73. Flexible sigmoidoscopy or colonoscopy. You may have a sigmoidoscopy every 5 years or a colonoscopy every 10 years starting at age 73. Prostate cancer screening. Recommendations will vary depending on your family history and other risks. Hepatitis C blood test. Hepatitis B blood test. Sexually transmitted disease (STD) testing. Diabetes screening. This is done by checking your blood sugar (glucose) after you have not eaten for a while (fasting). You may have this done every 1-3 years. Abdominal aortic aneurysm (AAA) screening. You may need this if you are a current or former smoker. Osteoporosis. You may be screened starting at age 73 if you are at high risk. Talk with your health care provider about your test results, treatment options, and if necessary, the need for more tests. Vaccines  Your health care provider may recommend certain vaccines, such as: Influenza vaccine. This is recommended every year. Tetanus, diphtheria, and acellular pertussis (Tdap, Td) vaccine. You may need a Td booster every 10 years. Zoster vaccine. You may need this after age 28. Pneumococcal 13-valent conjugate (PCV13) vaccine. One dose is recommended after age 36. Pneumococcal polysaccharide (PPSV23) vaccine. One dose is recommended after age 23. Talk to your health care provider about which screenings and vaccines you need and how often you need them. This information is not intended  to replace advice given to you by your health care provider. Make sure you discuss any questions you have with your  health care provider. Document Released: 08/12/2015 Document Revised: 04/04/2016 Document Reviewed: 05/17/2015 Elsevier Interactive Patient Education  2017 [employer].  Fall Prevention in the Home Falls can cause injuries. They can happen to people of all ages. There are many things you can do to make your home safe and to help prevent falls. What can I do on the outside of my home? Regularly fix the edges of walkways and driveways and fix any cracks. Remove anything that might make you trip as you walk through a door, such as a raised step or threshold. Trim any bushes or trees on the path to your home. Use bright outdoor lighting. Clear any walking paths of anything that might make someone trip, such as rocks or tools. Regularly check to see if handrails are loose or broken. Make sure that both sides of any steps have handrails. Any raised decks and porches should have guardrails on the edges. Have any leaves, snow, or ice cleared regularly. Use sand or salt on walking paths during winter. Clean up any spills in your garage right away. This includes oil or grease spills. What can I do in the bathroom? Use night lights. Install grab bars by the toilet and in the tub and shower. Do not use towel bars as grab bars. Use non-skid mats or decals in the tub or shower. If you need to sit down in the shower, use a plastic, non-slip stool. Keep the floor dry. Clean up any water that spills on the floor as soon as it happens. Remove soap buildup in the tub or shower regularly. Attach bath mats securely with double-sided non-slip rug tape. Do not have throw rugs and other things on the floor that can make you trip. What can I do in the bedroom? Use night lights. Make sure that you have a light by your bed that is easy to reach. Do not use any sheets or blankets that are too big for your bed. They should not hang down onto the floor. Have a firm chair that has side arms. You can use this for  support while you get dressed. Do not have throw rugs and other things on the floor that can make you trip. What can I do in the kitchen? Clean up any spills right away. Avoid walking on wet floors. Keep items that you use a lot in easy-to-reach places. If you need to reach something above you, use a strong step stool that has a grab bar. Keep electrical cords out of the way. Do not use floor polish or wax that makes floors slippery. If you must use wax, use non-skid floor wax. Do not have throw rugs and other things on the floor that can make you trip. What can I do with my stairs? Do not leave any items on the stairs. Make sure that there are handrails on both sides of the stairs and use them. Fix handrails that are broken or loose. Make sure that handrails are as long as the stairways. Check any carpeting to make sure that it is firmly attached to the stairs. Fix any carpet that is loose or worn. Avoid having throw rugs at the top or bottom of the stairs. If you do have throw rugs, attach them to the floor with carpet tape. Make sure that you have a light switch at the top of the stairs and  the bottom of the stairs. If you do not have them, ask someone to add them for you. What else can I do to help prevent falls? Wear shoes that: Do not have high heels. Have rubber bottoms. Are comfortable and fit you well. Are closed at the toe. Do not wear sandals. If you use a stepladder: Make sure that it is fully opened. Do not climb a closed stepladder. Make sure that both sides of the stepladder are locked into place. Ask someone to hold it for you, if possible. Clearly mark and make sure that you can see: Any grab bars or handrails. First and last steps. Where the edge of each step is. Use tools that help you move around (mobility aids) if they are needed. These include: Canes. Walkers. Scooters. Crutches. Turn on the lights when you go into a dark area. Replace any light bulbs as soon  as they burn out. Set up your furniture so you have a clear path. Avoid moving your furniture around. If any of your floors are uneven, fix them. If there are any pets around you, be aware of where they are. Review your medicines with your doctor. Some medicines can make you feel dizzy. This can increase your chance of falling. Ask your doctor what other things that you can do to help prevent falls. This information is not intended to replace advice given to you by your health care provider. Make sure you discuss any questions you have with your health care provider. Document Released: 05/12/2009 Document Revised: 12/22/2015 Document Reviewed: 08/20/2014 Elsevier Interactive Patient Education  2017 [employer].

## 2023-12-03 NOTE — Progress Notes (Signed)
 Subjective:   Jerry Rangel is a 73 y.o. male who presents for Medicare Annual/Subsequent preventive examination.  Visit Complete: Virtual I connected with  Jerry Rangel on 12/03/23 by a audio enabled telemedicine application and verified that I am speaking with the correct person using two identifiers.  Patient Location: Home  Provider Location: Home Office  I discussed the limitations of evaluation and management by telemedicine. The patient expressed understanding and agreed to proceed.  Vital Signs: Because this visit was a virtual/telehealth visit, some criteria may be missing or patient reported. Any vitals not documented were not able to be obtained and vitals that have been documented are patient reported.  Cardiac Risk Factors include: advanced age (>63men, >17 women);hypertension;male gender;diabetes mellitus;family history of premature cardiovascular disease     Objective:    There were no vitals filed for this visit. There is no height or weight on file to calculate BMI.     12/03/2023    1:15 PM 12/26/2022   11:06 AM  Advanced Directives  Does Patient Have a Medical Advance Directive? No No  Would patient like information on creating a medical advance directive? No - Patient declined No - Patient declined    Current Medications (verified) Outpatient Encounter Medications as of 12/03/2023  Medication Sig   aspirin 81 MG tablet Take 81 mg by mouth daily.   atenolol  (TENORMIN ) 50 MG tablet TAKE 1 TABLET BY MOUTH EVERY DAY   hydrochlorothiazide  (HYDRODIURIL ) 25 MG tablet TAKE 1 TABLET (25 MG TOTAL) BY MOUTH DAILY.   metFORMIN  (GLUCOPHAGE ) 500 MG tablet TAKE 1 TABLET BY MOUTH 2 TIMES DAILY WITH A MEAL.   NIFEdipine  (PROCARDIA  XL/NIFEDICAL-XL) 90 MG 24 hr tablet TAKE 1 TABLET BY MOUTH EVERY DAY   ONETOUCH VERIO test strip as directed.   rosuvastatin  (CRESTOR ) 10 MG tablet TAKE 1 TABLET BY MOUTH EVERY DAY   sildenafil  (VIAGRA ) 100 MG tablet Take 100 mg by mouth as  needed.   spironolactone  (ALDACTONE ) 25 MG tablet TAKE 1 TABLET (25 MG TOTAL) BY MOUTH DAILY.   tadalafil  (CIALIS ) 20 MG tablet Take 1 tablet (20 mg total) by mouth daily as needed for erectile dysfunction.   traZODone (DESYREL) 50 MG tablet Take 50-100 mg by mouth at bedtime as needed.   triamcinolone  cream (KENALOG ) 0.1 % Apply 1 Application topically 2 (two) times daily.   No facility-administered encounter medications on file as of 12/03/2023.    Allergies (verified) Ace inhibitors   History: Past Medical History:  Diagnosis Date   Diabetes mellitus without complication (HCC)    Erectile dysfunction    Hyperlipidemia    Hypertension    Peripheral arterial disease (HCC)    Past Surgical History:  Procedure Laterality Date   KNEE SURGERY Right    Family History  Problem Relation Age of Onset   Heart disease Mother    Kidney disease Mother    Diabetes Mother    Hypertension Mother    Kidney disease Sister    Heart disease Sister    Depression Sister    Social History   Socioeconomic History   Marital status: Married    Spouse name: Not on file   Number of children: Not on file   Years of education: Not on file   Highest education level: Not on file  Occupational History   Not on file  Tobacco Use   Smoking status: Never   Smokeless tobacco: Never  Vaping Use   Vaping status: Never Used  Substance and Sexual  Activity   Alcohol use: Yes    Alcohol/week: 2.0 standard drinks of alcohol    Types: 2 Glasses of wine per week   Drug use: No   Sexual activity: Yes    Birth control/protection: None  Other Topics Concern   Not on file  Social History Narrative   Not on file   Social Drivers of Health   Financial Resource Strain: Low Risk  (12/03/2023)   Overall Financial Resource Strain (CARDIA)    Difficulty of Paying Living Expenses: Not hard at all  Food Insecurity: No Food Insecurity (12/03/2023)   Hunger Vital Sign    Worried About Running Out of Food in the  Last Year: Never true    Ran Out of Food in the Last Year: Never true  Transportation Needs: No Transportation Needs (12/03/2023)   PRAPARE - Administrator, Civil Service (Medical): No    Lack of Transportation (Non-Medical): No  Physical Activity: Insufficiently Active (12/03/2023)   Exercise Vital Sign    Days of Exercise per Week: 4 days    Minutes of Exercise per Session: 30 min  Stress: No Stress Concern Present (12/03/2023)   [employer] of Occupational Health - Occupational Stress Questionnaire    Feeling of Stress : Not at all  Social Connections: Moderately Integrated (12/03/2023)   Social Connection and Isolation Panel [NHANES]    Frequency of Communication with Friends and Family: More than three times a week    Frequency of Social Gatherings with Friends and Family: Once a week    Attends Religious Services: More than 4 times per year    Active Member of [employer] or Organizations: No    Attends Engineer, structural: Never    Marital Status: Married    Tobacco Counseling Counseling given: Not Answered   Clinical Intake:  Pre-visit preparation completed: Yes  Pain : No/denies pain     Diabetes: Yes CBG done?: No Did pt. bring in CBG monitor from home?: No  How often do you need to have someone help you when you read instructions, pamphlets, or other written materials from your doctor or pharmacy?: 1 - Never  Interpreter Needed?: No  Information entered by :: Kieth Pelt LPN   Activities of Daily Living    12/03/2023    1:17 PM 12/26/2022   11:05 AM  In your present state of health, do you have any difficulty performing the following activities:  Hearing? 0 0  Vision? 0 0  Difficulty concentrating or making decisions? 0 0  Walking or climbing stairs? 0 0  Dressing or bathing? 0 0  Doing errands, shopping? 0 0  Preparing Food and eating ? N N  Using the Toilet? N N  In the past six months, have you accidently leaked urine? N N  Do you  have problems with loss of bowel control? N N  Managing your Medications? N N  Managing your Finances? N N  Housekeeping or managing your Housekeeping? N N    Patient Care Team: Benjiman Bras, MD as PCP - General (Family Medicine) Adine Hoof, MD as Consulting Physician (Vascular Surgery)  Indicate any recent Medical Services you may have received from other than [facility] providers in the past year (date may be approximate).     Assessment:   This is a routine wellness examination for Coram.  Hearing/Vision screen Hearing Screening - Comments:: No trouble hearing Vision Screening - Comments:: Up to date Unsure of name   Goals  Addressed             This Visit's Progress    Patient Stated       Stay healthy       Depression Screen    12/03/2023    1:20 PM 06/20/2023    9:21 AM 03/13/2023    9:43 AM 12/26/2022   11:09 AM 12/13/2022    9:26 AM 06/14/2022    8:51 AM 12/06/2021    9:06 AM  PHQ 2/9 Scores  PHQ - 2 Score 0 0 0 0 0 0 0  PHQ- 9 Score 1 0 1 2 0 3     Fall Risk    12/03/2023    1:13 PM 03/13/2023    9:43 AM 12/26/2022   11:04 AM 12/13/2022    9:26 AM 06/14/2022    8:51 AM  Fall Risk   Falls in the past year? 0 0 0 0 0  Number falls in past yr: 0 0 0 0 0  Injury with Fall? 0 0 0 0 0  Risk for fall due to :  No Fall Risks  No Fall Risks No Fall Risks  Follow up Falls evaluation completed;Education provided;Falls prevention discussed Falls evaluation completed Falls evaluation completed;Education provided;Falls prevention discussed Falls evaluation completed Falls evaluation completed    MEDICARE RISK AT HOME: Medicare Risk at Home Any stairs in or around the home?: No If so, are there any without handrails?: No Home free of loose throw rugs in walkways, pet beds, electrical cords, etc?: Yes Adequate lighting in your home to reduce risk of falls?: Yes Life alert?: No Use of a cane, walker or w/c?: No Grab bars in the bathroom?:  Yes Shower chair or bench in shower?: No Elevated toilet seat or a handicapped toilet?: No  TIMED UP AND GO:  Was the test performed?  No    Cognitive Function:        12/03/2023    1:17 PM 12/26/2022   11:17 AM 12/26/2022   11:06 AM  6CIT Screen  What Year? 0 points 0 points 0 points  What month? 0 points 0 points 0 points  What time? 0 points 0 points 0 points  Count back from 20 0 points 0 points 0 points  Months in reverse 0 points 0 points 0 points  Repeat phrase 0 points 0 points 0 points  Total Score 0 points 0 points 0 points    Immunizations Immunization History  Administered Date(s) Administered   DTaP 12/31/2006   Fluad Quad(high Dose 65+) 06/01/2021, 06/14/2022   Fluad Trivalent(High Dose 65+) 06/20/2023   Influenza, Quadrivalent, Recombinant, Inj, Pf 05/15/2018, 05/13/2019   Influenza-Unspecified 04/29/2018   Moderna Sars-Covid-2 Vaccination 02/22/2020, 03/21/2020, 01/03/2021   PNEUMOCOCCAL CONJUGATE-20 02/13/2023   Pneumococcal Conjugate-13 11/07/2017   Pneumococcal Polysaccharide-23 09/04/2021   Pneumococcal-Unspecified 02/13/2023   Tdap 02/13/2023   Zoster Recombinant(Shingrix) 01/17/2023    TDAP status: Up to date  Flu Vaccine status: Up to date  Pneumococcal vaccine status: Up to date  Covid-19 vaccine status: Information provided on how to obtain vaccines.   Qualifies for Shingles Vaccine? Yes   Zostavax completed Yes   Shingrix Completed?: Yes  Screening Tests Health Maintenance  Topic Date Due   Zoster Vaccines- Shingrix (2 of 2) 03/14/2023   COVID-19 Vaccine (4 - 2024-25 season) 03/31/2023   INFLUENZA VACCINE  02/28/2024   Diabetic kidney evaluation - eGFR measurement  06/19/2024   Diabetic kidney evaluation - Urine ACR  06/19/2024   Medicare  Annual Wellness (AWV)  12/02/2024   Fecal DNA (Cologuard)  01/21/2026   DTaP/Tdap/Td (3 - Td or Tdap) 02/12/2033   Pneumonia Vaccine 71+ Years old  Completed   Hepatitis C Screening  Completed    HPV VACCINES  Aged Out   Meningococcal B Vaccine  Aged Out   Colonoscopy  Discontinued    Health Maintenance  Health Maintenance Due  Topic Date Due   Zoster Vaccines- Shingrix (2 of 2) 03/14/2023   COVID-19 Vaccine (4 - 2024-25 season) 03/31/2023    Colorectal cancer screening: Type of screening: Cologuard. Completed 2027. Repeat every 3 years  Lung Cancer Screening: (Low Dose CT Chest recommended if Age 67-80 years, 20 pack-year currently smoking OR have quit w/in 15years.) does not qualify.   Lung Cancer Screening Referral:   Additional Screening:  Hepatitis C Screening: does not qualify; Completed 2023  Vision Screening: Recommended annual ophthalmology exams for early detection of glaucoma and other disorders of the eye. Is the patient up to date with their annual eye exam?  Yes  Who is the provider or what is the name of the office in which the patient attends annual eye exams? Unsure of name If pt is not established with a provider, would they like to be referred to a provider to establish care? No .   Dental Screening: Recommended annual dental exams for proper oral hygiene    Community Resource Referral / Chronic Care Management: CRR required this visit?  No   CCM required this visit?  No     Plan:     I have personally reviewed and noted the following in the patient's chart:   Medical and social history Use of alcohol, tobacco or illicit drugs  Current medications and supplements including opioid prescriptions. Patient is not currently taking opioid prescriptions. Functional ability and status Nutritional status Physical activity Advanced directives List of other physicians Hospitalizations, surgeries, and ER visits in previous 12 months Vitals Screenings to include cognitive, depression, and falls Referrals and appointments  In addition, I have reviewed and discussed with patient certain preventive protocols, quality metrics, and best practice  recommendations. A written personalized care plan for preventive services as well as general preventive health recommendations were provided to patient.     Kieth Pelt, LPN   04/03/6212   After Visit Summary: (MyChart) Due to this being a telephonic visit, the after visit summary with patients personalized plan was offered to patient via MyChart   Nurse Notes:

## 2023-12-19 ENCOUNTER — Encounter: Payer: Self-pay | Admitting: Family Medicine

## 2023-12-19 ENCOUNTER — Ambulatory Visit: Payer: Medicare HMO | Admitting: Family Medicine

## 2023-12-19 VITALS — BP 142/80 | HR 58 | Temp 98.1°F | Ht 71.0 in | Wt 178.2 lb

## 2023-12-19 DIAGNOSIS — I10 Essential (primary) hypertension: Secondary | ICD-10-CM | POA: Diagnosis not present

## 2023-12-19 DIAGNOSIS — R351 Nocturia: Secondary | ICD-10-CM

## 2023-12-19 DIAGNOSIS — M79674 Pain in right toe(s): Secondary | ICD-10-CM | POA: Diagnosis not present

## 2023-12-19 DIAGNOSIS — E1169 Type 2 diabetes mellitus with other specified complication: Secondary | ICD-10-CM

## 2023-12-19 DIAGNOSIS — B354 Tinea corporis: Secondary | ICD-10-CM | POA: Insufficient documentation

## 2023-12-19 DIAGNOSIS — E049 Nontoxic goiter, unspecified: Secondary | ICD-10-CM | POA: Insufficient documentation

## 2023-12-19 DIAGNOSIS — E1122 Type 2 diabetes mellitus with diabetic chronic kidney disease: Secondary | ICD-10-CM | POA: Insufficient documentation

## 2023-12-19 DIAGNOSIS — R9431 Abnormal electrocardiogram [ECG] [EKG]: Secondary | ICD-10-CM | POA: Insufficient documentation

## 2023-12-19 DIAGNOSIS — Z7982 Long term (current) use of aspirin: Secondary | ICD-10-CM | POA: Insufficient documentation

## 2023-12-19 DIAGNOSIS — D649 Anemia, unspecified: Secondary | ICD-10-CM | POA: Insufficient documentation

## 2023-12-19 DIAGNOSIS — R972 Elevated prostate specific antigen [PSA]: Secondary | ICD-10-CM | POA: Insufficient documentation

## 2023-12-19 DIAGNOSIS — Z888 Allergy status to other drugs, medicaments and biological substances status: Secondary | ICD-10-CM | POA: Insufficient documentation

## 2023-12-19 DIAGNOSIS — E785 Hyperlipidemia, unspecified: Secondary | ICD-10-CM

## 2023-12-19 DIAGNOSIS — M5432 Sciatica, left side: Secondary | ICD-10-CM | POA: Insufficient documentation

## 2023-12-19 DIAGNOSIS — R634 Abnormal weight loss: Secondary | ICD-10-CM | POA: Insufficient documentation

## 2023-12-19 DIAGNOSIS — N182 Chronic kidney disease, stage 2 (mild): Secondary | ICD-10-CM | POA: Insufficient documentation

## 2023-12-19 DIAGNOSIS — E663 Overweight: Secondary | ICD-10-CM | POA: Insufficient documentation

## 2023-12-19 DIAGNOSIS — R197 Diarrhea, unspecified: Secondary | ICD-10-CM | POA: Insufficient documentation

## 2023-12-19 DIAGNOSIS — F5101 Primary insomnia: Secondary | ICD-10-CM | POA: Insufficient documentation

## 2023-12-19 DIAGNOSIS — G5600 Carpal tunnel syndrome, unspecified upper limb: Secondary | ICD-10-CM | POA: Insufficient documentation

## 2023-12-19 DIAGNOSIS — E7800 Pure hypercholesterolemia, unspecified: Secondary | ICD-10-CM | POA: Insufficient documentation

## 2023-12-19 DIAGNOSIS — E039 Hypothyroidism, unspecified: Secondary | ICD-10-CM | POA: Insufficient documentation

## 2023-12-19 LAB — COMPREHENSIVE METABOLIC PANEL WITH GFR
ALT: 30 U/L (ref 0–53)
AST: 24 U/L (ref 0–37)
Albumin: 4.8 g/dL (ref 3.5–5.2)
Alkaline Phosphatase: 49 U/L (ref 39–117)
BUN: 17 mg/dL (ref 6–23)
CO2: 29 meq/L (ref 19–32)
Calcium: 10.1 mg/dL (ref 8.4–10.5)
Chloride: 100 meq/L (ref 96–112)
Creatinine, Ser: 1.37 mg/dL (ref 0.40–1.50)
GFR: 51.47 mL/min — ABNORMAL LOW (ref >60.00)
Glucose, Bld: 128 mg/dL — ABNORMAL HIGH (ref 70–99)
Potassium: 4.2 meq/L (ref 3.5–5.1)
Sodium: 137 meq/L (ref 135–145)
Total Bilirubin: 0.5 mg/dL (ref 0.2–1.2)
Total Protein: 7.6 g/dL (ref 6.0–8.3)

## 2023-12-19 LAB — POCT URINALYSIS DIP (MANUAL ENTRY)
Bilirubin, UA: NEGATIVE
Blood, UA: NEGATIVE
Glucose, UA: NEGATIVE mg/dL
Ketones, POC UA: NEGATIVE mg/dL
Leukocytes, UA: NEGATIVE
Nitrite, UA: NEGATIVE
Protein Ur, POC: NEGATIVE mg/dL
Spec Grav, UA: 1.01 (ref 1.010–1.025)
Urobilinogen, UA: 0.2 U/dL
pH, UA: 6 (ref 5.0–8.0)

## 2023-12-19 LAB — LIPID PANEL
Cholesterol: 134 mg/dL (ref 0–200)
HDL: 37.1 mg/dL — ABNORMAL LOW (ref >39.00)
LDL Cholesterol: 71 mg/dL (ref 0–99)
NonHDL: 97.29
Total CHOL/HDL Ratio: 4
Triglycerides: 129 mg/dL (ref 0.0–149.0)
VLDL: 25.8 mg/dL (ref 0.0–40.0)

## 2023-12-19 LAB — PSA, MEDICARE: PSA: 1.3 ng/mL (ref 0.10–4.00)

## 2023-12-19 LAB — HEMOGLOBIN A1C: Hgb A1c MFr Bld: 6.3 % (ref 4.6–6.5)

## 2023-12-19 NOTE — Progress Notes (Signed)
 Subjective:  Patient ID: Jerry Rangel, male    DOB: 10/18/1950  Age: 73 y.o. MRN: [id number]  CC:  Chief Complaint  Patient presents with   Polyuria    Pt stating he is frequenting he bath room more at night ( use to be twice at night but now 4 times). Right big toe numbness ( pt stated he has talk bout it before), feeling about the same    HPI Jerry Rangel presents for   Nocturia: Usually 2 times per night, now 4 times per night past 2 months. No fevers. No dysuria or hematuria.  Urologist in [facility] - Dr. Willye Harvey, appt in March 2024. Nocturia x3 at that time. Trial of cialis  - takes about every 2 weeks. No urology follow up - had recommended 1 year follow up.   Sexually active - no new partners, no penial d/c or pain in testicles.  No change in daytime symptoms. No retention symptoms.   R toe numbness: Has a history of peripheral arterial disease, followed by vascular, Dr. Vikki Graves , previously recommended walking program aspirin and statin.  Mild symptoms and 3 to 63-month follow-up had been discussed in October of last year, repeat eval in January - ABI's normal with preserved toe pressures. Follow up as needed - pain is getting better, only intermittent now, no swelling.   He is also due for chronic condition follow-up, last seen in November of last year.plan on labs today with follow up to discuss meds, results.    History Patient Active Problem List   Diagnosis Date Noted   Allergy to ACE inhibitors 12/19/2023   Absolute anemia 12/19/2023   Aspirin long-term use 12/19/2023   Flattened T wave 12/19/2023   Goiter 12/19/2023   Hypothyroidism 12/19/2023   Increasing prostate specific antigen level 12/19/2023   Overweight 12/19/2023   Primary insomnia 12/19/2023   Pure hypercholesterolemia 12/19/2023   Type 2 diabetes mellitus with diabetic chronic kidney disease (HCC) 12/19/2023   Stage 2 chronic kidney disease 12/19/2023   Tinea corporis 12/19/2023   Sciatica of  left side 12/19/2023   Weight loss 12/19/2023   Carpal tunnel syndrome 12/19/2023   Diarrhea 12/19/2023   Nocturia 07/03/2022   HTN (hypertension) 10/07/2012   Other and unspecified hyperlipidemia 10/07/2012   Organic impotence 10/07/2012   Past Medical History:  Diagnosis Date   Diabetes mellitus without complication (HCC)    Erectile dysfunction    Hyperlipidemia    Hypertension    Peripheral arterial disease (HCC)    Past Surgical History:  Procedure Laterality Date   KNEE SURGERY Right    Allergies  Allergen Reactions   Ace Inhibitors Swelling   Prior to Admission medications   Medication Sig Start Date End Date Taking? Authorizing Provider  aspirin 81 MG tablet Take 81 mg by mouth daily.   Yes [provider]  atenolol  (TENORMIN ) 50 MG tablet TAKE 1 TABLET BY MOUTH EVERY DAY 12/02/23  Yes Benjiman Bras, MD  hydrochlorothiazide  (HYDRODIURIL ) 25 MG tablet TAKE 1 TABLET (25 MG TOTAL) BY MOUTH DAILY. 09/02/23  Yes Benjiman Bras, MD  metFORMIN  (GLUCOPHAGE ) 500 MG tablet TAKE 1 TABLET BY MOUTH 2 TIMES DAILY WITH A MEAL. 09/02/23  Yes Benjiman Bras, MD  NIFEdipine  (PROCARDIA  XL/NIFEDICAL-XL) 90 MG 24 hr tablet TAKE 1 TABLET BY MOUTH EVERY DAY 08/01/23  Yes Benjiman Bras, MD  [facility] VERIO test strip as directed. 09/07/20  Yes [provider]  rosuvastatin  (CRESTOR ) 10 MG tablet TAKE 1  TABLET BY MOUTH EVERY DAY 08/14/23  Yes Benjiman Bras, MD  sildenafil  (VIAGRA ) 100 MG tablet Take 100 mg by mouth as needed. 08/10/13  Yes [provider]  spironolactone  (ALDACTONE ) 25 MG tablet TAKE 1 TABLET (25 MG TOTAL) BY MOUTH DAILY. 08/01/23  Yes Benjiman Bras, MD  tadalafil  (CIALIS ) 20 MG tablet Take 1 tablet (20 mg total) by mouth daily as needed for erectile dysfunction. 10/17/22  Yes Stoneking, Ponce Brisker., MD  traZODone (DESYREL) 50 MG tablet Take 50-100 mg by mouth at bedtime as needed. 12/19/20  Yes [provider]  triamcinolone  cream  (KENALOG ) 0.1 % Apply 1 Application topically 2 (two) times daily. 06/20/23  Yes Benjiman Bras, MD   Social History   Socioeconomic History   Marital status: Married    Spouse name: Not on file   Number of children: Not on file   Years of education: Not on file   Highest education level: Not on file  Occupational History   Not on file  Tobacco Use   Smoking status: Never   Smokeless tobacco: Never  Vaping Use   Vaping status: Never Used  Substance and Sexual Activity   Alcohol use: Yes    Alcohol/week: 2.0 standard drinks of alcohol    Types: 2 Glasses of wine per week   Drug use: No   Sexual activity: Yes    Birth control/protection: None  Other Topics Concern   Not on file  Social History Narrative   Not on file   Social Drivers of Health   Financial Resource Strain: Low Risk  (12/03/2023)   Overall Financial Resource Strain (CARDIA)    Difficulty of Paying Living Expenses: Not hard at all  Food Insecurity: No Food Insecurity (12/03/2023)   Hunger Vital Sign    Worried About Running Out of Food in the Last Year: Never true    Ran Out of Food in the Last Year: Never true  Transportation Needs: No Transportation Needs (12/03/2023)   PRAPARE - Administrator, Civil Service (Medical): No    Lack of Transportation (Non-Medical): No  Physical Activity: Insufficiently Active (12/03/2023)   Exercise Vital Sign    Days of Exercise per Week: 4 days    Minutes of Exercise per Session: 30 min  Stress: No Stress Concern Present (12/03/2023)   [employer] of Occupational Health - Occupational Stress Questionnaire    Feeling of Stress : Not at all  Social Connections: Moderately Integrated (12/03/2023)   Social Connection and Isolation Panel [NHANES]    Frequency of Communication with Friends and Family: More than three times a week    Frequency of Social Gatherings with Friends and Family: Once a week    Attends Religious Services: More than 4 times per year     Active Member of [employer] or Organizations: No    Attends Banker Meetings: Never    Marital Status: Married  Catering manager Violence: Not At Risk (12/03/2023)   Humiliation, Afraid, Rape, and Kick questionnaire    Fear of Current or Ex-Partner: No    Emotionally Abused: No    Physically Abused: No    Sexually Abused: No    Review of Systems Per HPI.   Objective:   Vitals:   12/19/23 1000 12/19/23 1057  BP: (!) 150/76 (!) 142/80  Pulse: (!) 58   Temp: 98.1 F (36.7 C)   TempSrc: Temporal   SpO2: 99%   Weight: 178 lb 4 oz (80.9  kg)   Height: 5\' 11"  (1.803 m)      Physical Exam Vitals reviewed.  Constitutional:      Appearance: He is well-developed.  HENT:     Head: Normocephalic and atraumatic.  Neck:     Vascular: No carotid bruit or JVD.  Cardiovascular:     Rate and Rhythm: Normal rate and regular rhythm.     Heart sounds: Normal heart sounds. No murmur heard. Pulmonary:     Effort: Pulmonary effort is normal.     Breath sounds: Normal breath sounds. No rales.  Abdominal:     General: Bowel sounds are normal. There is no distension.     Tenderness: There is no abdominal tenderness. There is no right CVA tenderness, left CVA tenderness or guarding.  Musculoskeletal:     Right lower leg: No edema.     Left lower leg: No edema.  Skin:    General: Skin is warm and dry.  Neurological:     Mental Status: He is alert and oriented to person, place, and time.  Psychiatric:        Mood and Affect: Mood normal.        Assessment & Plan:  Jerry Rangel is a 73 y.o. male . Nocturia - Plan: PSA, Medicare ( [employer] only), POCT urinalysis dipstick  - Subacute increase in symptoms, I did review his urology note from last year with 3 episodes per night in the past, but had improved.  Suspect component of BPH.  Denies any infection symptoms at this time, check urinalysis, PSA and close follow-up in the next 2 weeks but recommended he call urology for  follow-up as well as 1 year follow-up recommended.  Depending on that appointment and timing of next visit, could consider medication but would like to see PSA, urinalysis first.  RTC precautions given.  Pain of toe of right foot  - With PAD but reassuring testing at vascular as above.  Also improving symptoms, now only intermittent.  Continue to monitor.  Type 2 diabetes mellitus with hyperlipidemia (HCC) - Plan: Lipid panel, Hemoglobin A1c Essential hypertension - Plan: Comprehensive metabolic panel with GFR  - No med changes at this time, will follow-up in 2 weeks to review chronic conditions and lab results further.  Mild elevation BP at this time, will recheck at follow-up visit.  No orders of the defined types were placed in this encounter.  Patient Instructions  I will check some labs, but call urologist to follow up on nighttime urination. Depending on lab results and timing of that visit we may be able to discuss meds for these symptoms.   Glad the toe pain is getting better. Follow up if worsening, but recent testing at vascular doctor reassuring.   I will check labs today, but will discuss those results and your other meds at follow up. Take care!        Signed,   Caro Christmas, MD [facility], [facility] 12/19/23 10:57 AM

## 2023-12-19 NOTE — Patient Instructions (Addendum)
 I will check some labs, but call urologist to follow up on nighttime urination. Depending on lab results and timing of that visit we may be able to discuss meds for these symptoms.   Glad the toe pain is getting better. Follow up if worsening, but recent testing at vascular doctor reassuring.   I will check labs today, but will discuss those results and your other meds at follow up. Take care!

## 2023-12-24 ENCOUNTER — Ambulatory Visit: Payer: Self-pay | Admitting: Family Medicine

## 2024-01-02 ENCOUNTER — Telehealth: Payer: Self-pay

## 2024-01-02 ENCOUNTER — Ambulatory Visit: Admitting: Family Medicine

## 2024-01-02 ENCOUNTER — Other Ambulatory Visit (HOSPITAL_COMMUNITY): Payer: Self-pay

## 2024-01-02 ENCOUNTER — Encounter: Payer: Self-pay | Admitting: Family Medicine

## 2024-01-02 VITALS — BP 132/64 | HR 64 | Wt 179.0 lb

## 2024-01-02 DIAGNOSIS — M79674 Pain in right toe(s): Secondary | ICD-10-CM

## 2024-01-02 DIAGNOSIS — R351 Nocturia: Secondary | ICD-10-CM

## 2024-01-02 DIAGNOSIS — Z7984 Long term (current) use of oral hypoglycemic drugs: Secondary | ICD-10-CM | POA: Diagnosis not present

## 2024-01-02 DIAGNOSIS — E785 Hyperlipidemia, unspecified: Secondary | ICD-10-CM

## 2024-01-02 DIAGNOSIS — I10 Essential (primary) hypertension: Secondary | ICD-10-CM

## 2024-01-02 DIAGNOSIS — N401 Enlarged prostate with lower urinary tract symptoms: Secondary | ICD-10-CM

## 2024-01-02 DIAGNOSIS — E1169 Type 2 diabetes mellitus with other specified complication: Secondary | ICD-10-CM

## 2024-01-02 MED ORDER — NIFEDIPINE ER OSMOTIC RELEASE 90 MG PO TB24: 90.0000 mg | ORAL_TABLET | Freq: Every day | ORAL | 1 refills | Status: DC

## 2024-01-02 MED ORDER — TADALAFIL 5 MG PO TABS: 5.0000 mg | ORAL_TABLET | Freq: Every day | ORAL | 11 refills | Status: AC

## 2024-01-02 MED ORDER — SPIRONOLACTONE 25 MG PO TABS: 25.0000 mg | ORAL_TABLET | Freq: Every day | ORAL | 1 refills | Status: DC

## 2024-01-02 MED ORDER — ROSUVASTATIN CALCIUM 10 MG PO TABS: 10.0000 mg | ORAL_TABLET | Freq: Every day | ORAL | 1 refills | Status: DC

## 2024-01-02 MED ORDER — HYDROCHLOROTHIAZIDE 25 MG PO TABS: 25.0000 mg | ORAL_TABLET | Freq: Every day | ORAL | 1 refills | Status: DC

## 2024-01-02 MED ORDER — METFORMIN HCL 500 MG PO TABS: 500.0000 mg | ORAL_TABLET | Freq: Two times a day (BID) | ORAL | 1 refills | Status: DC

## 2024-01-02 MED ORDER — ATENOLOL 50 MG PO TABS: 50.0000 mg | ORAL_TABLET | Freq: Every day | ORAL | 1 refills | Status: DC

## 2024-01-02 NOTE — Progress Notes (Signed)
 Subjective:  Patient ID: Jerry Rangel, male    DOB: April 02, 1951  Age: 73 y.o. MRN: [id number]  CC:  Chief Complaint  Patient presents with   Medical Management of Chronic Issues    2 week follow up for noctuiria  Shingles Vaccine 2nd dose at [facility]  Possibly COVID .Aaron Aas.would have been [facility] as well     HPI [name] presents for   Nocturia Follow-up from discussion of symptoms May 22, prior 2 episodes of nocturia per night had increased to 4 times per night over the previous 2 months without dysuria hematuria or fever.  He was seen by urology in March of last year, nocturia multiple times at night at that time with trial of Cialis , but only taking it every few weeks and had not had urology follow-up.  He denied any daytime changes symptoms and no retention symptoms.  Suspect a component of BPH.  Labs were obtained, recommended urology follow-up. PSA was overall stable at 1.30, previously 1.00.  A1c looked okay at 6.3, down from 6.6 in November.  Urinalysis was negative. Cialis  every few weeks, not daily. Slight HA - brief sx's. Took 20mg  dose.  No vision/hearing changes, no CP/DOE. Would like to try daily low dose for BPH.   Has not scheduled urology yet for follow up.  2 episodes nocturia last night- has decreased nighttime water intake. No dysuria/hematuria.    Diabetes: Treated with metformin  500 mg twice daily.  He is on statin, allergy to ACE inhibitors. Home readings - 125-130. No lows.  Microalbumin: normal ratio 2.4 on 06/20/23  Optho, foot exam, pneumovax:  Foot exam today, R great toe on inside part of nail - no swelling, discharge. No current podiatrist.   Diabetic Foot Exam - Simple   Simple Foot Form Visual Inspection No deformities, no ulcerations, no other skin breakdown bilaterally: Yes Sensation Testing Pulse Check See comments: Yes Comments Toes warm, difficulty palpating DP pulse.  No wounds.  Possible ingrown nail at the right great toe, lateral  aspect but no surrounding skin erythema discharge or fluctuance at this time.  No appreciable discomfort on exam.       Lab Results  Component Value Date   HGBA1C 6.3 12/19/2023   HGBA1C 6.6 (H) 06/20/2023   HGBA1C 6.6 (H) 12/13/2022   Lab Results  Component Value Date   MICROALBUR 2.2 (H) 06/20/2023   LDLCALC 71 12/19/2023   CREATININE 1.37 12/19/2023    Hypertension: HCTZ 25 mg daily, nifedipine  90 mg daily, spironolactone  25 mg daily, atenolol  50 mg daily Home readings:  135-140/70-80 BP Readings from Last 3 Encounters:  01/02/24 132/64  12/19/23 (!) 142/80  08/28/23 (!) 141/73   Lab Results  Component Value Date   CREATININE 1.37 12/19/2023    Hyperlipidemia: Recent labs with LDL 71.  Crestor  10 mg daily.  No new myalgias/arthralgias with meds.  History of PAD, with visit in January at vascular.  Follow-up as needed.  Statin and aspirin. No new bleeding. No new myalgias with statin.  Lab Results  Component Value Date   CHOL 134 12/19/2023   HDL 37.10 (L) 12/19/2023   LDLCALC 71 12/19/2023   TRIG 129.0 12/19/2023   CHOLHDL 4 12/19/2023   Lab Results  Component Value Date   ALT 30 12/19/2023   AST 24 12/19/2023   ALKPHOS 49 12/19/2023   BILITOT 0.5 12/19/2023       History Patient Active Problem List   Diagnosis Date Noted   Allergy to ACE  inhibitors 12/19/2023   Absolute anemia 12/19/2023   Aspirin long-term use 12/19/2023   Flattened T wave 12/19/2023   Goiter 12/19/2023   Hypothyroidism 12/19/2023   Increasing prostate specific antigen level 12/19/2023   Overweight 12/19/2023   Primary insomnia 12/19/2023   Pure hypercholesterolemia 12/19/2023   Type 2 diabetes mellitus with diabetic chronic kidney disease (HCC) 12/19/2023   Stage 2 chronic kidney disease 12/19/2023   Tinea corporis 12/19/2023   Sciatica of left side 12/19/2023   Weight loss 12/19/2023   Carpal tunnel syndrome 12/19/2023   Diarrhea 12/19/2023   Nocturia 07/03/2022   HTN  (hypertension) 10/07/2012   Other and unspecified hyperlipidemia 10/07/2012   Organic impotence 10/07/2012   Past Medical History:  Diagnosis Date   Diabetes mellitus without complication (HCC)    Erectile dysfunction    Hyperlipidemia    Hypertension    Peripheral arterial disease (HCC)    Past Surgical History:  Procedure Laterality Date   KNEE SURGERY Right    Allergies  Allergen Reactions   Ace Inhibitors Swelling   Prior to Admission medications   Medication Sig Start Date End Date Taking? Authorizing Provider  aspirin 81 MG tablet Take 81 mg by mouth daily.   Yes [provider]  atenolol  (TENORMIN ) 50 MG tablet TAKE 1 TABLET BY MOUTH EVERY DAY 12/02/23  Yes Benjiman Bras, MD  hydrochlorothiazide  (HYDRODIURIL ) 25 MG tablet TAKE 1 TABLET (25 MG TOTAL) BY MOUTH DAILY. 09/02/23  Yes Benjiman Bras, MD  metFORMIN  (GLUCOPHAGE ) 500 MG tablet TAKE 1 TABLET BY MOUTH 2 TIMES DAILY WITH A MEAL. 09/02/23  Yes Benjiman Bras, MD  NIFEdipine  (PROCARDIA  XL/NIFEDICAL-XL) 90 MG 24 hr tablet TAKE 1 TABLET BY MOUTH EVERY DAY 08/01/23  Yes Benjiman Bras, MD  [facility] VERIO test strip as directed. 09/07/20  Yes [provider]  rosuvastatin  (CRESTOR ) 10 MG tablet TAKE 1 TABLET BY MOUTH EVERY DAY 08/14/23  Yes Benjiman Bras, MD  sildenafil  (VIAGRA ) 100 MG tablet Take 100 mg by mouth as needed. 08/10/13  Yes [provider]  spironolactone  (ALDACTONE ) 25 MG tablet TAKE 1 TABLET (25 MG TOTAL) BY MOUTH DAILY. 08/01/23  Yes Benjiman Bras, MD  tadalafil  (CIALIS ) 20 MG tablet Take 1 tablet (20 mg total) by mouth daily as needed for erectile dysfunction. 10/17/22  Yes Stoneking, Ponce Brisker., MD  traZODone (DESYREL) 50 MG tablet Take 50-100 mg by mouth at bedtime as needed. 12/19/20  Yes [provider]  triamcinolone  cream (KENALOG ) 0.1 % Apply 1 Application topically 2 (two) times daily. 06/20/23  Yes Benjiman Bras, MD   Social History   Socioeconomic  History   Marital status: Married    Spouse name: Not on file   Number of children: Not on file   Years of education: Not on file   Highest education level: Not on file  Occupational History   Not on file  Tobacco Use   Smoking status: Never   Smokeless tobacco: Never  Vaping Use   Vaping status: Never Used  Substance and Sexual Activity   Alcohol use: Yes    Alcohol/week: 2.0 standard drinks of alcohol    Types: 2 Glasses of wine per week   Drug use: No   Sexual activity: Yes    Birth control/protection: None  Other Topics Concern   Not on file  Social History Narrative   Not on file   Social Drivers of Health   Financial Resource Strain: Low Risk  (  12/03/2023)   Overall Financial Resource Strain (CARDIA)    Difficulty of Paying Living Expenses: Not hard at all  Food Insecurity: No Food Insecurity (12/03/2023)   Hunger Vital Sign    Worried About Running Out of Food in the Last Year: Never true    Ran Out of Food in the Last Year: Never true  Transportation Needs: No Transportation Needs (12/03/2023)   PRAPARE - Administrator, Civil Service (Medical): No    Lack of Transportation (Non-Medical): No  Physical Activity: Insufficiently Active (12/03/2023)   Exercise Vital Sign    Days of Exercise per Week: 4 days    Minutes of Exercise per Session: 30 min  Stress: No Stress Concern Present (12/03/2023)   [employer] of Occupational Health - Occupational Stress Questionnaire    Feeling of Stress : Not at all  Social Connections: Moderately Integrated (12/03/2023)   Social Connection and Isolation Panel [NHANES]    Frequency of Communication with Friends and Family: More than three times a week    Frequency of Social Gatherings with Friends and Family: Once a week    Attends Religious Services: More than 4 times per year    Active Member of [employer] or Organizations: No    Attends Banker Meetings: Never    Marital Status: Married  Catering manager  Violence: Not At Risk (12/03/2023)   Humiliation, Afraid, Rape, and Kick questionnaire    Fear of Current or Ex-Partner: No    Emotionally Abused: No    Physically Abused: No    Sexually Abused: No    Review of Systems  Constitutional:  Negative for fatigue and unexpected weight change.  Eyes:  Negative for visual disturbance.  Respiratory:  Negative for cough, chest tightness and shortness of breath.   Cardiovascular:  Negative for chest pain, palpitations and leg swelling.  Gastrointestinal:  Negative for abdominal pain and blood in stool.  Neurological:  Negative for dizziness, light-headedness and headaches.     Objective:   Vitals:   01/02/24 0853  BP: 132/64  Pulse: 64  SpO2: 100%  Weight: 179 lb (81.2 kg)     Physical Exam Vitals reviewed.  Constitutional:      Appearance: He is well-developed.  HENT:     Head: Normocephalic and atraumatic.  Neck:     Vascular: No carotid bruit or JVD.  Cardiovascular:     Rate and Rhythm: Normal rate and regular rhythm.     Heart sounds: Normal heart sounds. No murmur heard. Pulmonary:     Effort: Pulmonary effort is normal.     Breath sounds: Normal breath sounds. No rales.  Musculoskeletal:     Right lower leg: No edema.     Left lower leg: No edema.  Skin:    General: Skin is warm and dry.  Neurological:     Mental Status: He is alert and oriented to person, place, and time.  Psychiatric:        Mood and Affect: Mood normal.    Assessment & Plan:  Eran Mistry is a 73 y.o. male . Nocturia - Plan: tadalafil  (CIALIS ) 5 MG tablet Benign prostatic hyperplasia with nocturia - Plan: tadalafil  (CIALIS ) 5 MG tablet  - Stable slight improvement with fluid avoidance prior to bedtime.  Will try low-dose Cialis  at 5 mg daily for BPH treatment and follow-up with urology.  Type 2 diabetes mellitus with hyperlipidemia (HCC) - Plan: metFORMIN  (GLUCOPHAGE ) 500 MG tablet, Ambulatory referral to Podiatry  -  Improved control,  continue same dose of meds at this time.  Essential hypertension - Plan: hydrochlorothiazide  (HYDRODIURIL ) 25 MG tablet, NIFEdipine  (PROCARDIA  XL/NIFEDICAL-XL) 90 MG 24 hr tablet, atenolol  (TENORMIN ) 50 MG tablet, spironolactone  (ALDACTONE ) 25 MG tablet  - Stable control, continue same regimen  Hyperlipidemia, unspecified hyperlipidemia type - Plan: rosuvastatin  (CRESTOR ) 10 MG tablet  -  Stable, tolerating current regimen. Medications refilled. Labs pending as above.   Great toe pain, right - Plan: Ambulatory referral to Podiatry  - Possible ingrown toenail, but no sign of infection at this time.  With underlying diabetes, PAD, will refer to podiatry for evaluation and nail care.  Meds ordered this encounter  Medications   tadalafil  (CIALIS ) 5 MG tablet    Sig: Take 1 tablet (5 mg total) by mouth daily.    Dispense:  30 tablet    Refill:  11   hydrochlorothiazide  (HYDRODIURIL ) 25 MG tablet    Sig: Take 1 tablet (25 mg total) by mouth daily.    Dispense:  90 tablet    Refill:  1   metFORMIN  (GLUCOPHAGE ) 500 MG tablet    Sig: Take 1 tablet (500 mg total) by mouth 2 (two) times daily with a meal.    Dispense:  180 tablet    Refill:  1   NIFEdipine  (PROCARDIA  XL/NIFEDICAL-XL) 90 MG 24 hr tablet    Sig: Take 1 tablet (90 mg total) by mouth daily.    Dispense:  90 tablet    Refill:  1   rosuvastatin  (CRESTOR ) 10 MG tablet    Sig: Take 1 tablet (10 mg total) by mouth daily.    Dispense:  90 tablet    Refill:  1   atenolol  (TENORMIN ) 50 MG tablet    Sig: Take 1 tablet (50 mg total) by mouth daily.    Dispense:  90 tablet    Refill:  1   spironolactone  (ALDACTONE ) 25 MG tablet    Sig: Take 1 tablet (25 mg total) by mouth daily.    Dispense:  90 tablet    Refill:  1   Patient Instructions  Recent labs were reassuring.  Try taking the low-dose Cialis  daily to see if that helps with nighttime urination and possible enlarged prostate as a cause.  I do still recommend following up with  urologist to make sure they agree with this plan.  Please call for appointment.  No change in chronic medications today.  Follow-up in 6 months.  I will refer you to podiatry to discuss the right great toe pain.  They can discuss possible causes including possible ingrown toenail but I do not see any infection at this time.  Be seen if any new or worsening symptoms.  Take care!    Signed,   Caro Christmas, MD [facility], [facility] 01/02/24 9:56 AM

## 2024-01-02 NOTE — Patient Instructions (Signed)
 Recent labs were reassuring.  Try taking the low-dose Cialis  daily to see if that helps with nighttime urination and possible enlarged prostate as a cause.  I do still recommend following up with urologist to make sure they agree with this plan.  Please call for appointment.  No change in chronic medications today.  Follow-up in 6 months.  I will refer you to podiatry to discuss the right great toe pain.  They can discuss possible causes including possible ingrown toenail but I do not see any infection at this time.  Be seen if any new or worsening symptoms.  Take care!

## 2024-01-02 NOTE — Telephone Encounter (Signed)
 Pharmacy Patient Advocate Encounter   Received notification from CoverMyMeds that prior authorization for  Tadalafil  5MG  tablets is required/requested.   Insurance verification completed.   The patient is insured through [place] .   Per test claim: PA required; PA submitted to above mentioned insurance via CoverMyMeds Key/confirmation #/[id number] [facility] Status is pending

## 2024-01-09 ENCOUNTER — Encounter: Payer: Self-pay | Admitting: Podiatry

## 2024-01-09 ENCOUNTER — Ambulatory Visit: Admitting: Podiatry

## 2024-01-09 DIAGNOSIS — L600 Ingrowing nail: Secondary | ICD-10-CM

## 2024-01-09 DIAGNOSIS — B351 Tinea unguium: Secondary | ICD-10-CM

## 2024-01-09 DIAGNOSIS — M79675 Pain in left toe(s): Secondary | ICD-10-CM

## 2024-01-09 DIAGNOSIS — E1151 Type 2 diabetes mellitus with diabetic peripheral angiopathy without gangrene: Secondary | ICD-10-CM | POA: Diagnosis not present

## 2024-01-09 DIAGNOSIS — M79674 Pain in right toe(s): Secondary | ICD-10-CM | POA: Diagnosis not present

## 2024-01-09 NOTE — Telephone Encounter (Signed)
Please advise next steps

## 2024-01-09 NOTE — Progress Notes (Signed)
       Chief Complaint  Patient presents with   Diabetes    I'm having problems with my large toe on my right foot.  Dr. Ester Helms - saw last week; A1c  N - pain toe L - lateral hallux right D - 2-3 weeks O - suddenly C - tender, sore, Diabetic A - pressure T - soak in Epsom Salt    HPI: 73 y.o. male presents today with concern for ingrowing right first toenail.  This has been going on for 2 to 3 weeks.  He does endorse diabetes.  He denies significant redness or any drainage to the area.  Does have history of PAD.  Past Medical History:  Diagnosis Date   Diabetes mellitus without complication (HCC)    Erectile dysfunction    Hyperlipidemia    Hypertension    Peripheral arterial disease (HCC)     Past Surgical History:  Procedure Laterality Date   KNEE SURGERY Right     Allergies  Allergen Reactions   Ace Inhibitors Swelling    ROS    Physical Exam: There were no vitals filed for this visit.  General: The patient is alert and oriented x3 in no acute distress.  Dermatology: Skin is warm, dry and supple bilateral lower extremities.  Nail plates are thickened, elongated, dystrophic with yellow discoloration and subungual debris.  There is some incurvation to the right hallux lateral nail border without significant edema, significant erythema, drainage or signs of acute bacterial infection.  Vascular: Faintly palpable DP and PT pulses bilaterally.  Capillary refill 3 to 5 seconds to the digits.  No appreciable edema.  No erythema or calor.  Decreased pedal hair growth  Neurological: Light touch sensation grossly intact bilateral feet.  Protective sensation decreased.  Musculoskeletal Exam: No pedal deformities noted  Assessment/Plan of Care: 1. Ingrown toenail of right foot   2. Pain due to onychomycosis of toenails of both feet   3. Type 2 diabetes mellitus with diabetic peripheral angiopathy without gangrene, unspecified whether long term insulin use (HCC)       No orders of the defined types were placed in this encounter.  None  Discussed clinical findings with patient today.  Some concern for ingrowing right hallux nail lateral border.  Slant back nail trim performed today due to faint pulses, documented history of PAD, no signs of acute bacterial infection.  May soak toe in warm Epsom salts for the next week or so and apply small amount of Neosporin to the nail border to keep area soft.  Mupirocin and bandage applied today.  No open wound.  Did review prior vascular studies showing noncompressible vessels on ABI, later did have improved ABI values and multiphasic signals on arterial ultrasound from January.  Will have patient follow-up in 3 to 4 weeks for diabetic footcare.   Cedrick Partain L. Lunda Salines, AACFAS [facility]     [address] [place], [place] [address]                Office [phone]  Fax [phone]

## 2024-01-09 NOTE — Telephone Encounter (Signed)
 Pharmacy Patient Advocate Encounter  Received notification from [employer] that Prior Authorization for  Tadalafil  5MG  tablets  has been DENIED.  See denial reason below. No denial letter attached in CMM. Will attach denial letter to Media tab once received.   We denied coverage for this drug because: We cover this drug when our criteria are met. The unmet criteria are:  you have tried or cannot use an alpha blocker (for example: terazosin, doxazosin, tamsulosin, alfuzosin, silodosin), AND  you have tried or cannot use a 5-alpha reductase inhibitor (for example: finasteride, dutasteride).  PA #/Case ID/Reference #: Eustaquio Hight

## 2024-01-14 NOTE — Telephone Encounter (Signed)
Called and LM to call back to discuss. 

## 2024-01-14 NOTE — Telephone Encounter (Signed)
 It looks like insurance would like him to try one of the other classes of meds first, and then if that is not effective can then try Cialis .  I know some of his symptoms had improved with avoiding water prior to bedtime.  Depending on when he sees urology we can hold off on new meds for now and let them make a decision on additional medication, or we could try medication like Flomax prior to seeing urology.  Let me know how he would like to proceed.

## 2024-02-06 ENCOUNTER — Encounter: Payer: Self-pay | Admitting: Podiatry

## 2024-02-06 ENCOUNTER — Ambulatory Visit (INDEPENDENT_AMBULATORY_CARE_PROVIDER_SITE_OTHER): Admitting: Podiatry

## 2024-02-06 DIAGNOSIS — B351 Tinea unguium: Secondary | ICD-10-CM | POA: Diagnosis not present

## 2024-02-06 DIAGNOSIS — M79674 Pain in right toe(s): Secondary | ICD-10-CM

## 2024-02-06 DIAGNOSIS — M79675 Pain in left toe(s): Secondary | ICD-10-CM | POA: Diagnosis not present

## 2024-02-06 DIAGNOSIS — E1151 Type 2 diabetes mellitus with diabetic peripheral angiopathy without gangrene: Secondary | ICD-10-CM | POA: Diagnosis not present

## 2024-02-06 DIAGNOSIS — I739 Peripheral vascular disease, unspecified: Secondary | ICD-10-CM

## 2024-02-06 NOTE — Progress Notes (Signed)
  Subjective:  Patient ID: Jerry Rangel, male    DOB: 1951-07-11,  MRN: [id number]  Chief Complaint  Patient presents with   Debridement    Trim toenails-diabetic - 6.3    73 y.o. male presents with the above complaint. History confirmed with patient. Patient presenting with pain related to dystrophic thickened elongated nails. Patient is unable to trim own nails related to nail dystrophy and/or mobility issues. Patient does have a history of T2DM.  Last A1c 6.3.  Does have history of PAD.  Objective:  Physical Exam: warm, good capillary refill, pedal hair growth decreased, pedal skin atrophic nail exam onychomycosis of the toenails, onycholysis, and dystrophic nails DP pulses faintly palpable, PT pulses nonpalpable, and protective sensation intact Left Foot:  Pain with palpation of nails due to elongation and dystrophic growth.  Right Foot: Pain with palpation of nails due to elongation and dystrophic growth.   Assessment:   1. Type 2 diabetes mellitus with diabetic peripheral angiopathy without gangrene, unspecified whether long term insulin use (HCC)   2. Pain due to onychomycosis of toenails of both feet      Plan:  Patient was evaluated and treated and all questions answered.  #Onychomycosis with pain  -Nails palliatively debrided as below. -Educated on self-care  Procedure: Nail Debridement Rationale: Pain Type of Debridement: manual, sharp debridement. Instrumentation: Nail nipper, rotary burr. Number of Nails: 10  # Diabetes with PAD Patient educated on diabetes. Discussed proper diabetic foot care and discussed risks and complications of disease. Educated patient in depth on reasons to return to the office immediately should he/she discover anything concerning or new on the feet. All questions answered. Discussed proper shoes as well.  - Discussed monitoring for signs of claudication or tissue loss - Prior ABIs from October 2024 notable for noncompressible vessels  bilaterally Return in about 3 months (around 05/08/2024) for Diabetic Foot Care.         Ethan Saddler, DPM [facility] / [facility]

## 2024-04-23 NOTE — Progress Notes (Signed)
 Jerry Rangel                                          MRN: [id number]   04/23/2024   The VBCI Quality Team Specialist reviewed this patient medical record for the purposes of chart review for care gap closure. The following were reviewed: eGFR & UACR.    VBCI Quality Team

## 2024-05-08 ENCOUNTER — Ambulatory Visit: Admitting: Podiatry

## 2024-05-14 ENCOUNTER — Ambulatory Visit (INDEPENDENT_AMBULATORY_CARE_PROVIDER_SITE_OTHER): Admitting: Podiatry

## 2024-05-14 ENCOUNTER — Encounter: Payer: Self-pay | Admitting: Podiatry

## 2024-05-14 DIAGNOSIS — M79674 Pain in right toe(s): Secondary | ICD-10-CM

## 2024-05-14 DIAGNOSIS — B354 Tinea corporis: Secondary | ICD-10-CM

## 2024-05-14 DIAGNOSIS — M79675 Pain in left toe(s): Secondary | ICD-10-CM | POA: Diagnosis not present

## 2024-05-14 DIAGNOSIS — E1151 Type 2 diabetes mellitus with diabetic peripheral angiopathy without gangrene: Secondary | ICD-10-CM

## 2024-05-14 DIAGNOSIS — B351 Tinea unguium: Secondary | ICD-10-CM | POA: Diagnosis not present

## 2024-05-14 MED ORDER — CLOTRIMAZOLE-BETAMETHASONE 1-0.05 % EX CREA: 1.0000 | TOPICAL_CREAM | Freq: Every day | CUTANEOUS | 0 refills | Status: AC

## 2024-05-14 NOTE — Progress Notes (Signed)
  Subjective:  Patient ID: Jerry Rangel, male    DOB: May 17, 1951,  MRN: [id number]  Chief Complaint  Patient presents with   [facility]    [facility]  A1c 6.3  81 mg Asprin    73 y.o. male presents with the above complaint. History confirmed with patient. Patient presenting with pain related to dystrophic thickened elongated nails. Patient is unable to trim own nails related to nail dystrophy and/or mobility issues. Patient does have a history of T2DM.  Last A1c 6.3.  Does have history of PAD.  He is asking about lesion of darkened skin changes dorsal lateral right foot, states it does not really cause pain but is itching intermittently.  Objective:  Physical Exam: warm, capillary refill 3 to 5 seconds to the digits., pedal hair growth decreased, pedal skin atrophic nail exam onychomycosis of the toenails, onycholysis, and dystrophic nails DP pulses faintly palpable, PT pulses nonpalpable, and protective sensation intact Left Foot:  Pain with palpation of nails due to elongation and dystrophic growth.  Right Foot: Pain with palpation of nails due to elongation and dystrophic growth.  Dorsal right foot there is area of somewhat darkened skin discoloration.  Skin temperature gradient equal to contralateral side.  Affected skin is dry and somewhat scaly.   Assessment:   1. Tinea corporis   2. Type 2 diabetes mellitus with diabetic peripheral angiopathy without gangrene, unspecified whether long term insulin use (HCC)   3. Pain due to onychomycosis of toenails of both feet      Plan:  Patient was evaluated and treated and all questions answered.  #Onychomycosis with pain  -Nails palliatively debrided as below. -Educated on self-care  Procedure: Nail Debridement Rationale: Pain Type of Debridement: manual, sharp debridement. Instrumentation: Nail nipper, rotary burr. Number of Nails: 10  # Diabetes with PAD Patient educated on diabetes. Discussed proper diabetic foot care and discussed risks  and complications of disease. Educated patient in depth on reasons to return to the office immediately should he/she discover anything concerning or new on the feet. All questions answered. Discussed proper shoes as well.  - Discussed monitoring for signs of claudication or tissue loss - Prior ABIs from October 2024 notable for noncompressible vessels bilaterally - Presently denying claudication symptoms.  # Tinea corporis/tinea pedis -Discussed that this is likely etiology for the skin changes right foot.  Did also discuss monitoring for signs of tissue loss due to the concomitant PAD. -Prescription for Lotrisone cream sent to patient's pharmacy.  Use once a day.  Use for 3 weeks. -Reevaluate about 3 weeks to monitor for progression.  Contact office if signs of worsening infection develop or if worsening symptoms occur. -I certify that this diagnosis represents a distinct and separate diagnosis that requires evaluation and treatment separate from other procedures or diagnosis    Return in about 3 weeks (around 06/04/2024) for Athletes foot check.         Ethan Saddler, DPM [facility] / [facility]

## 2024-06-04 ENCOUNTER — Ambulatory Visit: Admitting: Podiatry

## 2024-06-04 ENCOUNTER — Encounter: Payer: Self-pay | Admitting: Podiatry

## 2024-06-04 DIAGNOSIS — E1151 Type 2 diabetes mellitus with diabetic peripheral angiopathy without gangrene: Secondary | ICD-10-CM

## 2024-06-04 DIAGNOSIS — I739 Peripheral vascular disease, unspecified: Secondary | ICD-10-CM

## 2024-06-04 DIAGNOSIS — B353 Tinea pedis: Secondary | ICD-10-CM | POA: Diagnosis not present

## 2024-06-04 MED ORDER — KETOCONAZOLE 2 % EX CREA: 1.0000 | TOPICAL_CREAM | Freq: Every day | CUTANEOUS | 0 refills | Status: DC

## 2024-06-04 NOTE — Progress Notes (Signed)
       Chief Complaint  Patient presents with   Tinea Pedis    Pt feels area is getting a little smaller. Used Lortisone creme for 1 week pt said it caused a blister. Diabetic A1c 6.3. 81 mg Asprin    HPI: 73 y.o. male presents today following up for rash to the right dorsal lateral forefoot.  Overall it is unchanged from previous.  He did try the Lotrisone cream and states that it caused 3 lesions which he is states were similar to a blister, denies any drainage associated with it.  Does seem to have resolved since then.  Denies any itching, not as tender as previously.  Does have diabetes with PAD.  Past Medical History:  Diagnosis Date   Diabetes mellitus without complication (HCC)    Erectile dysfunction    Hyperlipidemia    Hypertension    Peripheral arterial disease     Past Surgical History:  Procedure Laterality Date   KNEE SURGERY Right     Allergies  Allergen Reactions   Ace Inhibitors Swelling    ROS    Physical Exam: There were no vitals filed for this visit.  General: The patient is alert and oriented x3 in no acute distress.  Dermatology: Dorsal right forefoot area of subtle skin darkening noted from previous.  This area is cool to touch relative to contralateral side.  No open wounds, no drainage, no fluctuance crepitation.  Vascular: Capillary refill 3 to 5 seconds to digits.  Decreased pedal hair growth.  DP pulses faintly palpable, PT pulses nonpalpable.  Skin temperature gradient decreased right side versus left side where the right forefoot and affected skin is cool to touch.  Neurological: Light touch sensation grossly intact bilateral feet  Musculoskeletal Exam: No pedal deformities noted   Assessment/Plan of Care: 1. PAD (peripheral artery disease)   2. Tinea pedis of right foot   3. Type 2 diabetes mellitus with diabetic peripheral angiopathy without gangrene, unspecified whether long term insulin use (HCC)      Meds ordered this encounter   Medications   ketoconazole (NIZORAL) 2 % cream    Sig: Apply 1 Application topically daily.    Dispense:  30 g    Refill:  0   VAS US  ABI WITH/WO TBI    Discussed clinical findings with patient today.  Right foot rash and skin discoloration differential remains for vascular causes versus fungal skin infection.  Ordering updated ABI, does have ABI from January 2025 showing normal range for right lower extremity, left side noncompressible.  Will place vascular referral if appropriate with the results.  Discontinue the Lotrisone.  Can try ketoconazole once a day for the next couple weeks.  Reevaluate in about 3 weeks.  Discussed signs and symptoms of claudication or limb ischemia, instructed patient notify office and go to emergency room if this were to develop.   Caitlynn Ju L. Lamount MAUL, AACFAS [facility]     [address] [place], [place] [address]                Office [phone]  Fax [phone]

## 2024-06-11 ENCOUNTER — Ambulatory Visit (HOSPITAL_COMMUNITY)
Admission: RE | Admit: 2024-06-11 | Discharge: 2024-06-11 | Disposition: A | Discharge status: Home / Self Care | Source: Ambulatory Visit | Attending: Podiatry | Admitting: Podiatry

## 2024-06-11 DIAGNOSIS — I739 Peripheral vascular disease, unspecified: Secondary | ICD-10-CM | POA: Insufficient documentation

## 2024-06-11 LAB — VAS US ABI WITH/WO TBI

## 2024-06-19 ENCOUNTER — Ambulatory Visit: Payer: Self-pay | Admitting: Podiatry

## 2024-06-19 DIAGNOSIS — I739 Peripheral vascular disease, unspecified: Secondary | ICD-10-CM

## 2024-07-06 ENCOUNTER — Ambulatory Visit: Admitting: Family Medicine

## 2024-07-09 ENCOUNTER — Encounter: Payer: Self-pay | Admitting: Family Medicine

## 2024-07-09 ENCOUNTER — Ambulatory Visit: Admitting: Family Medicine

## 2024-07-09 VITALS — BP 118/68 | HR 59 | Temp 98.0°F | Resp 14 | Ht 72.0 in | Wt 180.2 lb

## 2024-07-09 DIAGNOSIS — N401 Enlarged prostate with lower urinary tract symptoms: Secondary | ICD-10-CM

## 2024-07-09 DIAGNOSIS — D509 Iron deficiency anemia, unspecified: Secondary | ICD-10-CM | POA: Insufficient documentation

## 2024-07-09 DIAGNOSIS — Z0000 Encounter for general adult medical examination without abnormal findings: Secondary | ICD-10-CM

## 2024-07-09 DIAGNOSIS — E1151 Type 2 diabetes mellitus with diabetic peripheral angiopathy without gangrene: Secondary | ICD-10-CM

## 2024-07-09 DIAGNOSIS — E1169 Type 2 diabetes mellitus with other specified complication: Secondary | ICD-10-CM

## 2024-07-09 DIAGNOSIS — E785 Hyperlipidemia, unspecified: Secondary | ICD-10-CM

## 2024-07-09 DIAGNOSIS — I10 Essential (primary) hypertension: Secondary | ICD-10-CM

## 2024-07-09 LAB — LIPID PANEL
Cholesterol: 103 mg/dL (ref 0–200)
HDL: 32.8 mg/dL — ABNORMAL LOW (ref >39.00)
LDL Cholesterol: 50 mg/dL (ref 0–99)
NonHDL: 70.33
Total CHOL/HDL Ratio: 3
Triglycerides: 104 mg/dL (ref 0.0–149.0)
VLDL: 20.8 mg/dL (ref 0.0–40.0)

## 2024-07-09 LAB — CBC
HCT: 36.8 % — ABNORMAL LOW (ref 39.0–52.0)
Hemoglobin: 12.5 g/dL — ABNORMAL LOW (ref 13.0–17.0)
MCHC: 34.1 g/dL (ref 30.0–36.0)
MCV: 80.2 fl (ref 78.0–100.0)
Platelets: 171 K/uL (ref 150.0–400.0)
RBC: 4.59 Mil/uL (ref 4.22–5.81)
RDW: 15.3 % (ref 11.5–15.5)
WBC: 6.6 K/uL (ref 4.0–10.5)

## 2024-07-09 LAB — COMPREHENSIVE METABOLIC PANEL WITH GFR
ALT: 31 U/L (ref 0–53)
AST: 26 U/L (ref 0–37)
Albumin: 4.7 g/dL (ref 3.5–5.2)
Alkaline Phosphatase: 48 U/L (ref 39–117)
BUN: 15 mg/dL (ref 6–23)
CO2: 29 meq/L (ref 19–32)
Calcium: 9.7 mg/dL (ref 8.4–10.5)
Chloride: 101 meq/L (ref 96–112)
Creatinine, Ser: 1.18 mg/dL (ref 0.40–1.50)
GFR: 61.33 mL/min (ref >60.00)
Glucose, Bld: 136 mg/dL — ABNORMAL HIGH (ref 70–99)
Potassium: 3.6 meq/L (ref 3.5–5.1)
Sodium: 138 meq/L (ref 135–145)
Total Bilirubin: 0.5 mg/dL (ref 0.2–1.2)
Total Protein: 7.4 g/dL (ref 6.0–8.3)

## 2024-07-09 LAB — MICROALBUMIN / CREATININE URINE RATIO
Creatinine,U: 57.5 mg/dL
Microalb Creat Ratio: 31.4 mg/g — ABNORMAL HIGH (ref 0.0–30.0)
Microalb, Ur: 1.8 mg/dL (ref 0.0–1.9)

## 2024-07-09 LAB — TSH: TSH: 0.49 u[IU]/mL (ref 0.35–5.50)

## 2024-07-09 LAB — HEMOGLOBIN A1C: Hgb A1c MFr Bld: 6.3 % (ref 4.6–6.5)

## 2024-07-09 MED ORDER — METFORMIN HCL 500 MG PO TABS: 500.0000 mg | ORAL_TABLET | Freq: Two times a day (BID) | ORAL | 1 refills | Status: AC

## 2024-07-09 MED ORDER — ATENOLOL 50 MG PO TABS: 50.0000 mg | ORAL_TABLET | Freq: Every day | ORAL | 1 refills | Status: AC

## 2024-07-09 MED ORDER — NIFEDIPINE ER OSMOTIC RELEASE 90 MG PO TB24: 90.0000 mg | ORAL_TABLET | Freq: Every day | ORAL | 1 refills | Status: AC

## 2024-07-09 MED ORDER — ROSUVASTATIN CALCIUM 10 MG PO TABS: 10.0000 mg | ORAL_TABLET | Freq: Every day | ORAL | 1 refills | Status: AC

## 2024-07-09 MED ORDER — HYDROCHLOROTHIAZIDE 25 MG PO TABS: 25.0000 mg | ORAL_TABLET | Freq: Every day | ORAL | 1 refills | Status: AC

## 2024-07-09 MED ORDER — SPIRONOLACTONE 25 MG PO TABS: 25.0000 mg | ORAL_TABLET | Freq: Every day | ORAL | 1 refills | Status: AC

## 2024-07-09 NOTE — Patient Instructions (Addendum)
 Thank you for coming in today. No change in medications at this time. If there are any concerns on your bloodwork, I will let you know. Take care!  Preventive Care 23 Years and Older, Male Preventive care refers to lifestyle choices and visits with your health care provider that can promote health and wellness. Preventive care visits are also called wellness exams. What can I expect for my preventive care visit? Counseling During your preventive care visit, your health care provider may ask about your: Medical history, including: Past medical problems. Family medical history. History of falls. Current health, including: Emotional well-being. Home life and relationship well-being. Sexual activity. Memory and ability to understand (cognition). Lifestyle, including: Alcohol, nicotine or tobacco, and drug use. Access to firearms. Diet, exercise, and sleep habits. Work and work Astronomer. Sunscreen use. Safety issues such as seatbelt and bike helmet use. Physical exam Your health care provider will check your: Height and weight. These may be used to calculate your BMI (body mass index). BMI is a measurement that tells if you are at a healthy weight. Waist circumference. This measures the distance around your waistline. This measurement also tells if you are at a healthy weight and may help predict your risk of certain diseases, such as type 2 diabetes and high blood pressure. Heart rate and blood pressure. Body temperature. Skin for abnormal spots. What immunizations do I need?  Vaccines are usually given at various ages, according to a schedule. Your health care provider will recommend vaccines for you based on your age, medical history, and lifestyle or other factors, such as travel or where you work. What tests do I need? Screening Your health care provider may recommend screening tests for certain conditions. This may include: Lipid and cholesterol levels. Diabetes screening.  This is done by checking your blood sugar (glucose) after you have not eaten for a while (fasting). Hepatitis C test. Hepatitis B test. HIV (human immunodeficiency virus) test. STI (sexually transmitted infection) testing, if you are at risk. Lung cancer screening. Colorectal cancer screening. Prostate cancer screening. Abdominal aortic aneurysm (AAA) screening. You may need this if you are a current or former smoker. Talk with your health care provider about your test results, treatment options, and if necessary, the need for more tests. Follow these instructions at home: Eating and drinking  Eat a diet that includes fresh fruits and vegetables, whole grains, lean protein, and low-fat dairy products. Limit your intake of foods with high amounts of sugar, saturated fats, and salt. Take vitamin and mineral supplements as recommended by your health care provider. Do not drink alcohol if your health care provider tells you not to drink. If you drink alcohol: Limit how much you have to 0-2 drinks a day. Know how much alcohol is in your drink. In the [place] one drink equals one 12 oz bottle of beer (355 mL), one 5 oz glass of wine (148 mL), or one 1 oz glass of hard liquor (44 mL). Lifestyle Brush your teeth every morning and night with fluoride toothpaste. Floss one time each day. Exercise for at least 30 minutes 5 or more days each week. Do not use any products that contain nicotine or tobacco. These products include cigarettes, chewing tobacco, and vaping devices, such as e-cigarettes. If you need help quitting, ask your health care provider. Do not use drugs. If you are sexually active, practice safe sex. Use a condom or other form of protection to prevent STIs. Take aspirin only as told by your health  care provider. Make sure that you understand how much to take and what form to take. Work with your health care provider to find out whether it is safe and beneficial for you to take aspirin  daily. Ask your health care provider if you need to take a cholesterol-lowering medicine (statin). Find healthy ways to manage stress, such as: Meditation, yoga, or listening to music. Journaling. Talking to a trusted person. Spending time with friends and family. Safety Always wear your seat belt while driving or riding in a vehicle. Do not drive: If you have been drinking alcohol. Do not ride with someone who has been drinking. When you are tired or distracted. While texting. If you have been using any mind-altering substances or drugs. Wear a helmet and other protective equipment during sports activities. If you have firearms in your house, make sure you follow all gun safety procedures. Minimize exposure to UV radiation to reduce your risk of skin cancer. What's next? Visit your health care provider once a year for an annual wellness visit. Ask your health care provider how often you should have your eyes and teeth checked. Stay up to date on all vaccines. This information is not intended to replace advice given to you by your health care provider. Make sure you discuss any questions you have with your health care provider. Document Revised: 01/11/2021 Document Reviewed: 01/11/2021 Elsevier Patient Education  2024 [employer].

## 2024-07-09 NOTE — Progress Notes (Unsigned)
 q  Subjective:  Patient ID: Jerry Rangel, male    DOB: Jun 09, 1951  Age: 73 y.o. MRN: [id number]  CC:  Chief Complaint  Patient presents with   Annual Exam    No questions or concerns.     HPI Jerry Rangel presents for Annual Exam  PCP, me Podiatry, Dr. Lamount - Peripheral arterial disease, tinea pedis of right foot.  Diabetic foot exam.  Office visit November 6th.  ABI had indicated noncompressible vessels of the large arteries of leg and abnormal toe pressures of the right foot.  Plan for follow-up with vascular surgery. Has not received call yet. Referral was placed by podiatry. Vascular surgery, Dr. Sheree, eval January 29.  Previous claudication symptoms.  At that time indicated arterial to duplex on the right and ABIs were normal with preserved toe pressures.  Follow-up as needed and continued walking recommended. Urology, Dr. Roseann, visit in March 2024.  Erectile dysfunction treated with tadalafil .  1 year follow-up planned.  Of note we did start Cialis  5 mg daily at his June visit for possible BPH treatment and follow-up with urology recommended. Will be going to urology next month. Less frequent nocturia with daily cialis . 1-2 times per night, prior 3.  Ophthalmology, Dr. Roz - retired. Appt with new optho next month. Advised to have DM eye exam report sent.   Diabetes: With hyperlipidemia, treated with metformin  500 mg twice daily, and he is on a statin.  ACE inhibitor allergy. No new med side effects.  Home readings: 124-140.  No sx lows, no 200's.  Microalbumin: Normal ratio 2.4 on 06/20/2023, repeat order placed today Optho, foot exam, pneumovax:  Due for ophthalmology exam - next month.  Exercise - walking per day.  Lab Results  Component Value Date   HGBA1C 6.3 12/19/2023   HGBA1C 6.6 (H) 06/20/2023   HGBA1C 6.6 (H) 12/13/2022   Lab Results  Component Value Date   LDLCALC 71 12/19/2023   CREATININE 1.37 12/19/2023   Hypertension: HCTZ 25 mg daily,  nifedipine  90 mg daily, spironolactone  25 mg daily, atenolol  50 mg daily.  Initial BP elevated in triage today was low at 94/52.  Denies orthostatic sx's, no lightheadedness/dizziness. Repeat Home readings: 130 range BP Readings from Last 3 Encounters:  07/09/24 118/68  01/02/24 132/64  12/19/23 (!) 142/80   Lab Results  Component Value Date   CREATININE 1.37 12/19/2023   Hyperlipidemia: Crestor  10 mg daily, no new myalgias.  Lab Results  Component Value Date   CHOL 134 12/19/2023   HDL 37.10 (L) 12/19/2023   LDLCALC 71 12/19/2023   TRIG 129.0 12/19/2023   CHOLHDL 4 12/19/2023   Lab Results  Component Value Date   ALT 30 12/19/2023   AST 24 12/19/2023   ALKPHOS 49 12/19/2023   BILITOT 0.5 12/19/2023          07/09/2024    8:47 AM 01/02/2024    8:59 AM 12/19/2023    9:56 AM 12/03/2023    1:20 PM 06/20/2023    9:21 AM  Depression screen PHQ 2/9  Decreased Interest 0 0 0 0 0  Down, Depressed, Hopeless 0 0 0 0 0  PHQ - 2 Score 0 0 0 0 0  Altered sleeping 0 0 0 1 0  Tired, decreased energy 0 0 0 0 0  Change in appetite 0 0 0 0 0  Feeling bad or failure about yourself  0 0 0 0 0  Trouble concentrating 0 0 0 0 0  Moving slowly or fidgety/restless 0 0 0 0 0  Suicidal thoughts 0 0 0 0 0  PHQ-9 Score 0 0  0  1  0   Difficult doing work/chores Not difficult at all Not difficult at all Not difficult at all Not difficult at all      Data saved with a previous flowsheet row definition    Health Maintenance  Topic Date Due   Diabetic kidney evaluation - Urine ACR  Never done   OPHTHALMOLOGY EXAM  10/09/2023   HEMOGLOBIN A1C  06/20/2024   COVID-19 Vaccine (4 - 2025-26 season) 07/25/2024 (Originally 03/30/2024)   Zoster Vaccines- Shingrix (2 of 2) 10/07/2024 (Originally 03/14/2023)   Influenza Vaccine  10/27/2024 (Originally 02/28/2024)   Medicare Annual Wellness (AWV)  12/02/2024   Diabetic kidney evaluation - eGFR measurement  12/18/2024   FOOT EXAM  07/09/2025   Fecal DNA  (Cologuard)  01/21/2026   DTaP/Tdap/Td (3 - Td or Tdap) 02/12/2033   Pneumococcal Vaccine: 50+ Years  Completed   Hepatitis C Screening  Completed   Meningococcal B Vaccine  Aged Out   Colonoscopy  Discontinued  Cologuard was negative on 01/22/2023, repeat 3 years. Prostate: does not have family history of prostate cancer, has been followed by urologist as above.  PSA in the normal range most recently checked in May. Lab Results  Component Value Date   PSA 1.30 12/19/2023   PSA 1.00 06/14/2022   PSA 0.79 10/07/2012      Immunization History  Administered Date(s) Administered   DTaP 12/31/2006   Fluad Quad(high Dose 65+) 06/01/2021, 06/14/2022   Fluad Trivalent(High Dose 65+) 06/20/2023   Influenza, Quadrivalent, Recombinant, Inj, Pf 05/15/2018, 05/13/2019   Influenza-Unspecified 04/29/2018   Moderna Sars-Covid-2 Vaccination 02/22/2020, 03/21/2020, 01/03/2021   PNEUMOCOCCAL CONJUGATE-20 02/13/2023   Pneumococcal Conjugate-13 11/07/2017   Pneumococcal Polysaccharide-23 09/04/2021   Pneumococcal-Unspecified 02/13/2023   Tdap 02/13/2023   Zoster Recombinant(Shingrix) 01/17/2023  COVID flu and shingles - plans at pharmacy. He thinks he had 2nd shingrix at pharmacy.    No results found. Optho next month  Dental:Every 6 mo - in January.   Alcohol: none recently.   Tobacco: none, no vaping  Exercise: walking per day.    History Patient Active Problem List   Diagnosis Date Noted   Iron deficiency anemia 07/09/2024   Allergy to ACE inhibitors 12/19/2023   Absolute anemia 12/19/2023   Aspirin long-term use 12/19/2023   Flattened T wave 12/19/2023   Goiter 12/19/2023   Hypothyroidism 12/19/2023   Increasing prostate specific antigen level 12/19/2023   Overweight 12/19/2023   Primary insomnia 12/19/2023   Pure hypercholesterolemia 12/19/2023   Type 2 diabetes mellitus with diabetic chronic kidney disease (HCC) 12/19/2023   Stage 2 chronic kidney disease 12/19/2023    Tinea corporis 12/19/2023   Sciatica of left side 12/19/2023   Weight loss 12/19/2023   Carpal tunnel syndrome 12/19/2023   Diarrhea 12/19/2023   Nocturia 07/03/2022   HTN (hypertension) 10/07/2012   Other and unspecified hyperlipidemia 10/07/2012   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 Authorizing Provider  aspirin 81 MG tablet Take 81 mg by mouth daily.  Yes [provider]  atenolol  (TENORMIN ) 50 MG tablet Take 1 tablet (50 mg total) by mouth daily. 01/02/24  Yes Levora Reyes SAUNDERS, MD  clotrimazole -betamethasone  (LOTRISONE ) cream Apply 1 Application topically daily. 05/14/24  Yes Semon, Jake L, DPM  hydrochlorothiazide  (HYDRODIURIL ) 25 MG tablet Take 1 tablet (25 mg total) by mouth daily. 01/02/24  Yes Levora Reyes SAUNDERS, MD  metFORMIN  (GLUCOPHAGE ) 500 MG tablet Take 1 tablet (500 mg total) by mouth 2 (two) times daily with a meal. 01/02/24  Yes Levora Reyes SAUNDERS, MD  NIFEdipine  (PROCARDIA  XL/NIFEDICAL-XL) 90 MG 24 hr tablet Take 1 tablet (90 mg total) by mouth daily. 01/02/24  Yes Levora Reyes SAUNDERS, MD  [facility] VERIO test strip as directed. 09/07/20  Yes [provider]  rosuvastatin  (CRESTOR ) 10 MG tablet Take 1 tablet (10 mg total) by mouth daily. 01/02/24  Yes Levora Reyes SAUNDERS, MD  spironolactone  (ALDACTONE ) 25 MG tablet Take 1 tablet (25 mg total) by mouth daily. 01/02/24  Yes Levora Reyes SAUNDERS, MD  tadalafil  (CIALIS ) 5 MG tablet Take 1 tablet (5 mg total) by mouth daily. 01/02/24  Yes Levora Reyes SAUNDERS, MD  ketoconazole  (NIZORAL ) 2 % cream Apply 1 Application topically daily. Patient not taking: Reported on 07/09/2024 06/04/24   Lamount Ethan CROME, DPM  traZODone (DESYREL) 50 MG tablet Take 50-100 mg by mouth at bedtime as needed. Patient not taking: Reported on 07/09/2024 12/19/20   [provider]  triamcinolone  cream (KENALOG ) 0.1 % Apply 1 Application topically 2 (two) times daily. Patient not taking: Reported on 07/09/2024 06/20/23   Levora Reyes SAUNDERS, MD   Social History   Socioeconomic History   Marital status: Married    Spouse name: Not on file   Number of children: Not on file   Years of education: Not on file   Highest education level: Not on file  Occupational History   Not on file  Tobacco Use   Smoking status: Never   Smokeless tobacco: Never  Vaping Use   Vaping status: Never Used  Substance and Sexual Activity   Alcohol use: Yes    Alcohol/week: 2.0 standard drinks of alcohol    Types: 2 Glasses of wine per week    Comment: occasionally   Drug use: No   Sexual activity: Yes    Birth control/protection: None  Other Topics Concern   Not on file  Social History Narrative   Not on file   Social Drivers of Health   Tobacco Use: Low Risk (07/09/2024)   Patient History    Smoking Tobacco Use: Never    Smokeless Tobacco Use: Never    Passive Exposure: Not on file  Financial Resource Strain: Low Risk (12/03/2023)   Overall Financial Resource Strain (CARDIA)    Difficulty of Paying Living Expenses: Not hard at all  Food Insecurity: No Food Insecurity (12/03/2023)   Hunger Vital Sign    Worried About Running Out of Food in the Last Year: Never true    Ran Out of Food in the Last Year: Never true  Transportation Needs: No Transportation Needs (12/03/2023)   PRAPARE - Administrator, Civil Service (Medical): No    Lack of Transportation (Non-Medical): No  Physical Activity: Insufficiently Active (12/03/2023)   Exercise Vital Sign    Days of Exercise per Week: 4 days    Minutes of Exercise per Session: 30 min  Stress: No Stress Concern Present (12/03/2023)   [employer] of  Occupational Health - Occupational Stress  Questionnaire    Feeling of Stress : Not at all  Social Connections: Moderately Integrated (12/03/2023)   Social Connection and Isolation Panel    Frequency of Communication with Friends and Family: More than three times a week    Frequency of Social Gatherings with Friends and Family: Once a week    Attends Religious Services: More than 4 times per year    Active Member of [employer] or Organizations: No    Attends Banker Meetings: Never    Marital Status: Married  Catering Manager Violence: Not At Risk (12/03/2023)   Humiliation, Afraid, Rape, and Kick questionnaire    Fear of Current or Ex-Partner: No    Emotionally Abused: No    Physically Abused: No    Sexually Abused: No  Depression (PHQ2-9): Low Risk (07/09/2024)   Depression (PHQ2-9)    PHQ-2 Score: 0  Alcohol Screen: Low Risk (12/03/2023)   Alcohol Screen    Last Alcohol Screening Score (AUDIT): 2  Housing: Unknown (12/03/2023)   Housing Stability Vital Sign    Unable to Pay for Housing in the Last Year: No    Number of Times Moved in the Last Year: Not on file    Homeless in the Last Year: No  Utilities: Not At Risk (12/03/2023)   [facility] Utilities    Threatened with loss of utilities: No  Health Literacy: Adequate Health Literacy (12/03/2023)   B1300 Health Literacy    Frequency of need for help with medical instructions: Never    Review of Systems 13 point review of systems per patient health survey noted.  Negative other than as indicated above or in HPI.    Objective:   Vitals:   07/09/24 0841 07/09/24 0914  BP: (!) 94/52 118/68  Pulse: (!) 59   Resp: 14   Temp: 98 F (36.7 C)   TempSrc: Temporal   SpO2: 99%   Weight: 180 lb 3.2 oz (81.7 kg)   Height: 6' (1.829 m)      Physical Exam Vitals reviewed.  Constitutional:      Appearance: He is well-developed.  HENT:     Head: Normocephalic and atraumatic.     Right Ear: External ear normal.     Left Ear:  External ear normal.  Eyes:     Conjunctiva/sclera: Conjunctivae normal.     Pupils: Pupils are equal, round, and reactive to light.  Neck:     Thyroid : No thyromegaly.  Cardiovascular:     Rate and Rhythm: Normal rate and regular rhythm.     Heart sounds: Normal heart sounds.  Pulmonary:     Effort: Pulmonary effort is normal. No respiratory distress.     Breath sounds: Normal breath sounds. No wheezing.  Abdominal:     General: There is no distension.     Palpations: Abdomen is soft.     Tenderness: There is no abdominal tenderness.  Musculoskeletal:        General: No tenderness. Normal range of motion.     Cervical back: Normal range of motion and neck supple.  Lymphadenopathy:     Cervical: No cervical adenopathy.  Skin:    General: Skin is warm and dry.  Neurological:     Mental Status: He is alert and oriented to person, place, and time.     Deep Tendon Reflexes: Reflexes are normal and symmetric.  Psychiatric:        Behavior: Behavior normal.     Assessment & Plan:  Jerry Rangel is  a 73 y.o. male . Annual physical exam - Plan: Comprehensive metabolic panel with GFR, Lipid panel, Microalbumin / creatinine urine ratio, Hemoglobin A1c, CBC, TSH  - -anticipatory guidance as below in AVS, screening labs above. Health maintenance items as above in HPI discussed/recommended as applicable.   Type 2 diabetes mellitus with diabetic peripheral angiopathy without gangrene, unspecified whether long term insulin use (HCC) Type 2 diabetes mellitus with hyperlipidemia (HCC) - Plan: Comprehensive metabolic panel with GFR, Microalbumin / creatinine urine ratio, Hemoglobin A1c, metFORMIN  (GLUCOPHAGE ) 500 MG tablet  -  Stable, tolerating current regimen. Medications refilled. Labs pending as above.   Essential hypertension - Plan: CBC, TSH, NIFEdipine  (PROCARDIA  XL/NIFEDICAL-XL) 90 MG 24 hr tablet, atenolol  (TENORMIN ) 50 MG tablet, hydrochlorothiazide  (HYDRODIURIL ) 25 MG tablet,  spironolactone  (ALDACTONE ) 25 MG tablet  -  Stable, tolerating current regimen. Medications refilled. Labs pending as above.   Benign prostatic hyperplasia with nocturia  - Follow-up with urology as planned.  Tolerating Cialis .  Hyperlipidemia, unspecified hyperlipidemia type - Plan: Comprehensive metabolic panel with GFR, Lipid panel, rosuvastatin  (CRESTOR ) 10 MG tablet  -  Stable, tolerating current regimen. Medications refilled. Labs pending as above.     Meds ordered this encounter  Medications   metFORMIN  (GLUCOPHAGE ) 500 MG tablet    Sig: Take 1 tablet (500 mg total) by mouth 2 (two) times daily with a meal.    Dispense:  180 tablet    Refill:  1   NIFEdipine  (PROCARDIA  XL/NIFEDICAL-XL) 90 MG 24 hr tablet    Sig: Take 1 tablet (90 mg total) by mouth daily.    Dispense:  90 tablet    Refill:  1   atenolol  (TENORMIN ) 50 MG tablet    Sig: Take 1 tablet (50 mg total) by mouth daily.    Dispense:  90 tablet    Refill:  1   hydrochlorothiazide  (HYDRODIURIL ) 25 MG tablet    Sig: Take 1 tablet (25 mg total) by mouth daily.    Dispense:  90 tablet    Refill:  1   spironolactone  (ALDACTONE ) 25 MG tablet    Sig: Take 1 tablet (25 mg total) by mouth daily.    Dispense:  90 tablet    Refill:  1   rosuvastatin  (CRESTOR ) 10 MG tablet    Sig: Take 1 tablet (10 mg total) by mouth daily.    Dispense:  90 tablet    Refill:  1   Patient Instructions  Thank you for coming in today. No change in medications at this time. If there are any concerns on your bloodwork, I will let you know. Take care!  Preventive Care 54 Years and Older, Male Preventive care refers to lifestyle choices and visits with your health care provider that can promote health and wellness. Preventive care visits are also called wellness exams. What can I expect for my preventive care visit? Counseling During your preventive care visit, your health care provider may ask about your: Medical history, including: Past  medical problems. Family medical history. History of falls. Current health, including: Emotional well-being. Home life and relationship well-being. Sexual activity. Memory and ability to understand (cognition). Lifestyle, including: Alcohol, nicotine or tobacco, and drug use. Access to firearms. Diet, exercise, and sleep habits. Work and work astronomer. Sunscreen use. Safety issues such as seatbelt and bike helmet use. Physical exam Your health care provider will check your: Height and weight. These may be used to calculate your BMI (body mass index). BMI is a measurement that tells if  you are at a healthy weight. Waist circumference. This measures the distance around your waistline. This measurement also tells if you are at a healthy weight and may help predict your risk of certain diseases, such as type 2 diabetes and high blood pressure. Heart rate and blood pressure. Body temperature. Skin for abnormal spots. What immunizations do I need?  Vaccines are usually given at various ages, according to a schedule. Your health care provider will recommend vaccines for you based on your age, medical history, and lifestyle or other factors, such as travel or where you work. What tests do I need? Screening Your health care provider may recommend screening tests for certain conditions. This may include: Lipid and cholesterol levels. Diabetes screening. This is done by checking your blood sugar (glucose) after you have not eaten for a while (fasting). Hepatitis C test. Hepatitis B test. HIV (human immunodeficiency virus) test. STI (sexually transmitted infection) testing, if you are at risk. Lung cancer screening. Colorectal cancer screening. Prostate cancer screening. Abdominal aortic aneurysm (AAA) screening. You may need this if you are a current or former smoker. Talk with your health care provider about your test results, treatment options, and if necessary, the need for more  tests. Follow these instructions at home: Eating and drinking  Eat a diet that includes fresh fruits and vegetables, whole grains, lean protein, and low-fat dairy products. Limit your intake of foods with high amounts of sugar, saturated fats, and salt. Take vitamin and mineral supplements as recommended by your health care provider. Do not drink alcohol if your health care provider tells you not to drink. If you drink alcohol: Limit how much you have to 0-2 drinks a day. Know how much alcohol is in your drink. In the [place] one drink equals one 12 oz bottle of beer (355 mL), one 5 oz glass of wine (148 mL), or one 1 oz glass of hard liquor (44 mL). Lifestyle Brush your teeth every morning and night with fluoride toothpaste. Floss one time each day. Exercise for at least 30 minutes 5 or more days each week. Do not use any products that contain nicotine or tobacco. These products include cigarettes, chewing tobacco, and vaping devices, such as e-cigarettes. If you need help quitting, ask your health care provider. Do not use drugs. If you are sexually active, practice safe sex. Use a condom or other form of protection to prevent STIs. Take aspirin only as told by your health care provider. Make sure that you understand how much to take and what form to take. Work with your health care provider to find out whether it is safe and beneficial for you to take aspirin daily. Ask your health care provider if you need to take a cholesterol-lowering medicine (statin). Find healthy ways to manage stress, such as: Meditation, yoga, or listening to music. Journaling. Talking to a trusted person. Spending time with friends and family. Safety Always wear your seat belt while driving or riding in a vehicle. Do not drive: If you have been drinking alcohol. Do not ride with someone who has been drinking. When you are tired or distracted. While texting. If you have been using any mind-altering substances  or drugs. Wear a helmet and other protective equipment during sports activities. If you have firearms in your house, make sure you follow all gun safety procedures. Minimize exposure to UV radiation to reduce your risk of skin cancer. What's next? Visit your health care provider once a year for an annual wellness  visit. Ask your health care provider how often you should have your eyes and teeth checked. Stay up to date on all vaccines. This information is not intended to replace advice given to you by your health care provider. Make sure you discuss any questions you have with your health care provider. Document Revised: 01/11/2021 Document Reviewed: 01/11/2021 Elsevier Patient Education  2024 Elsevier Inc.   Signed,   Reyes Pines, MD  Primary Care, [facility] 07/09/2024 9:36 AM       [1]  Allergies Allergen Reactions   Ace Inhibitors Swelling

## 2024-07-15 ENCOUNTER — Ambulatory Visit: Payer: Self-pay | Admitting: Family Medicine

## 2024-08-03 ENCOUNTER — Ambulatory Visit: Attending: Surgery | Admitting: Physician Assistant

## 2024-08-03 VITALS — BP 162/80 | Temp 97.0°F | Wt 181.9 lb

## 2024-08-03 DIAGNOSIS — I739 Peripheral vascular disease, unspecified: Secondary | ICD-10-CM

## 2024-08-03 NOTE — Progress Notes (Addendum)
 " HISTORY AND PHYSICAL     CC:  follow up. Requesting Provider:  Levora Reyes SAUNDERS, MD  HPI: This is a 74 y.o. male who is here today for follow up for PAD.  Pt has hx of DM, HLD, BPH, and HTN.    Pt was last seen 08/28/2023 for PAD and at that time, he did not have frank claudication or tissue loss.  His RLE arterial duplex was patent without stenosis and he was to return prn.   He was seen by Dr. Lamount in early November and had a rash on the lateral aspect of the foot (right) and ABI's were obtained and he had a toe pressure of zero.  He was referred back for evaluatioin  The pt returns today for follow up and here with his wife of over 20 years.  He states he is doing well.  He did have a rash back in November and used a lotion on it and he developed some blistering, however, this did heal.  He denies any claudication, rest pain or non healing wounds.  He does get some soreness in the foot just proximal to the toes but this is not constant and is not too bothersome to him.  He does have some discoloration to the right foot.  He does do an epsom salt soak daily.  He does have feeling in the foot.  He is now having regular visits with Dr. Lamount.  He is compliant with his asa/statin.   The pt is on a statin for cholesterol management.    The pt is on an aspirin.    Other AC:  none The pt is on BB, diuretic for hypertension.  The pt is  on diabetic medication. Tobacco hx:  never  Pt does not have family hx of AAA.  Past Medical History:  Diagnosis Date   Diabetes mellitus without complication (HCC)    Erectile dysfunction    Hyperlipidemia    Hypertension    Peripheral arterial disease     Past Surgical History:  Procedure Laterality Date   KNEE SURGERY Right     Allergies[1]  Current Outpatient Medications  Medication Sig Dispense Refill   aspirin 81 MG tablet Take 81 mg by mouth daily.     atenolol  (TENORMIN ) 50 MG tablet Take 1 tablet (50 mg total) by mouth daily. 90 tablet  1   clotrimazole -betamethasone  (LOTRISONE ) cream Apply 1 Application topically daily. 30 g 0   hydrochlorothiazide  (HYDRODIURIL ) 25 MG tablet Take 1 tablet (25 mg total) by mouth daily. 90 tablet 1   metFORMIN  (GLUCOPHAGE ) 500 MG tablet Take 1 tablet (500 mg total) by mouth 2 (two) times daily with a meal. 180 tablet 1   NIFEdipine  (PROCARDIA  XL/NIFEDICAL-XL) 90 MG 24 hr tablet Take 1 tablet (90 mg total) by mouth daily. 90 tablet 1   ONETOUCH VERIO test strip as directed.     rosuvastatin  (CRESTOR ) 10 MG tablet Take 1 tablet (10 mg total) by mouth daily. 90 tablet 1   spironolactone  (ALDACTONE ) 25 MG tablet Take 1 tablet (25 mg total) by mouth daily. 90 tablet 1   tadalafil  (CIALIS ) 5 MG tablet Take 1 tablet (5 mg total) by mouth daily. 30 tablet 11   No current facility-administered medications for this visit.    Family History  Problem Relation Age of Onset   Heart disease Mother    Kidney disease Mother    Diabetes Mother    Hypertension Mother    Kidney disease  Sister    Heart disease Sister    Depression Sister     Social History   Socioeconomic History   Marital status: Married    Spouse name: Not on file   Number of children: Not on file   Years of education: Not on file   Highest education level: Not on file  Occupational History   Not on file  Tobacco Use   Smoking status: Never   Smokeless tobacco: Never  Vaping Use   Vaping status: Never Used  Substance and Sexual Activity   Alcohol use: Yes    Alcohol/week: 2.0 standard drinks of alcohol    Types: 2 Glasses of wine per week    Comment: occasionally   Drug use: No   Sexual activity: Yes    Birth control/protection: None  Other Topics Concern   Not on file  Social History Narrative   Not on file   Social Drivers of Health   Tobacco Use: Low Risk (07/09/2024)   Patient History    Smoking Tobacco Use: Never    Smokeless Tobacco Use: Never    Passive Exposure: Not on file  Financial Resource Strain:  Low Risk (12/03/2023)   Overall Financial Resource Strain (CARDIA)    Difficulty of Paying Living Expenses: Not hard at all  Food Insecurity: No Food Insecurity (12/03/2023)   Hunger Vital Sign    Worried About Running Out of Food in the Last Year: Never true    Ran Out of Food in the Last Year: Never true  Transportation Needs: No Transportation Needs (12/03/2023)   PRAPARE - Administrator, Civil Service (Medical): No    Lack of Transportation (Non-Medical): No  Physical Activity: Insufficiently Active (12/03/2023)   Exercise Vital Sign    Days of Exercise per Week: 4 days    Minutes of Exercise per Session: 30 min  Stress: No Stress Concern Present (12/03/2023)   [employer] of Occupational Health - Occupational Stress Questionnaire    Feeling of Stress : Not at all  Social Connections: Moderately Integrated (12/03/2023)   Social Connection and Isolation Panel    Frequency of Communication with Friends and Family: More than three times a week    Frequency of Social Gatherings with Friends and Family: Once a week    Attends Religious Services: More than 4 times per year    Active Member of [employer] or Organizations: No    Attends Banker Meetings: Never    Marital Status: Married  Catering Manager Violence: Not At Risk (12/03/2023)   Humiliation, Afraid, Rape, and Kick questionnaire    Fear of Current or Ex-Partner: No    Emotionally Abused: No    Physically Abused: No    Sexually Abused: No  Depression (PHQ2-9): Low Risk (07/09/2024)   Depression (PHQ2-9)    PHQ-2 Score: 0  Alcohol Screen: Low Risk (12/03/2023)   Alcohol Screen    Last Alcohol Screening Score (AUDIT): 2  Housing: Unknown (12/03/2023)   Housing Stability Vital Sign    Unable to Pay for Housing in the Last Year: No    Number of Times Moved in the Last Year: Not on file    Homeless in the Last Year: No  Utilities: Not At Risk (12/03/2023)   AHC Utilities    Threatened with loss of utilities: No   Health Literacy: Adequate Health Literacy (12/03/2023)   B1300 Health Literacy    Frequency of need for help with medical instructions: Never  REVIEW OF SYSTEMS:   [X]  denotes positive finding, [ ]  denotes negative finding Cardiac  Comments:  Chest pain or chest pressure:    Shortness of breath upon exertion:    Short of breath when lying flat:    Irregular heart rhythm:        Vascular    Pain in calf, thigh, or hip brought on by ambulation:    Pain in feet at night that wakes you up from your sleep:     Blood clot in your veins:    Leg swelling:         Pulmonary    Oxygen at home:    Productive cough:     Wheezing:         Neurologic    Sudden weakness in arms or legs:     Sudden numbness in arms or legs:     Sudden onset of difficulty speaking or slurred speech:    Temporary loss of vision in one eye:     Problems with dizziness:         Gastrointestinal    Blood in stool:     Vomited blood:         Genitourinary    Burning when urinating:     Blood in urine:        Psychiatric    Major depression:         Hematologic    Bleeding problems:    Problems with blood clotting too easily:        Skin    Rashes or ulcers:        Constitutional    Fever or chills:      PHYSICAL EXAMINATION:  Today's Vitals   08/03/24 0819  BP: (!) 162/80  Temp: (!) 97 F (36.1 C)  TempSrc: Temporal  Weight: 181 lb 14.4 oz (82.5 kg)   Body mass index is 24.67 kg/m.   General:  WDWN in NAD; vital signs documented above Gait: Not observed HENT: WNL, normocephalic Pulmonary: normal non-labored breathing , without wheezing Cardiac: regular HR, without carotid bruits Abdomen: soft, NT; aortic pulse is not palpable Skin: without rashes Vascular Exam/Pulses:  Right Left  Radial 2+ (normal) 2+ (normal)  DP Brisk biphasic 2+ (normal) (brisk biphasic)  PT Brisk biphasic Brisk biphasic  Peroneal Brisk biphasic Brisk biphasic   Extremities: without ischemic  changes, without Gangrene , without cellulitis; without open wounds; right foot with darkening color compared to left.  Musculoskeletal: no muscle wasting or atrophy  Neurologic: A&O X 3 Psychiatric:  The pt has Normal affect.   Non-Invasive Vascular Imaging:   ABI's/TBI's on 06/11/2025: Right:  [place]/0 - Great toe pressure: 0 Left:  [place]/0.7 - Great toe pressure: 105   Previous ABI's/TBI's on 08/28/2023: Right:  1.27/0.89 - Great toe pressure: 124 Left:  [place]/1.45 - Great toe pressure:  202  Previous arterial duplex on 08/28/2023: Right: Patent lower extremity without evidence of stenosis     ASSESSMENT/PLAN:: 74 y.o. male here for follow up for PAD with recent hx of right foot rash and blisters that resolved.     -pt with brisk doppler flow in right foot.  Toe pressure is absent.  He is without any claudication, rest pain or ulcers.  He does have some darkening of the right foot.  He does have feeling in the right foot.   -discussed that as long as he does not have rest pain, ulcers or lifestyle limiting claudication, we would not intervene, however,  if he develops any of these, we would proceed with angiogram, which I discussed with he and his wife.   -ok to continue epsom salt soaks but advised to make sure his feet are completely dry afterward.   -continue asa/statin -discussed importance of increased walking daily.  He walks every day at least 30 minutes without any issues.  Encouraged him to continue this.   -pt will f/u as needed, but with that being said, he and his wife expressed good understanding, should he develop rest pain or non healing wounds, we would need to see him.  Also encouraged that he will be seeing Dr. Lamount on a regular basis and if he has any concerns as well, we will be glad to see him back.     Lucie Apt, [facility] [phone]  Clinic MD:   [place]     [1]  Allergies Allergen Reactions   Ace Inhibitors Swelling   "

## 2024-08-13 ENCOUNTER — Ambulatory Visit (INDEPENDENT_AMBULATORY_CARE_PROVIDER_SITE_OTHER): Admitting: Podiatry

## 2024-08-13 DIAGNOSIS — E1151 Type 2 diabetes mellitus with diabetic peripheral angiopathy without gangrene: Secondary | ICD-10-CM

## 2024-08-13 DIAGNOSIS — M79674 Pain in right toe(s): Secondary | ICD-10-CM | POA: Diagnosis not present

## 2024-08-13 DIAGNOSIS — M79675 Pain in left toe(s): Secondary | ICD-10-CM | POA: Diagnosis not present

## 2024-08-13 DIAGNOSIS — B353 Tinea pedis: Secondary | ICD-10-CM

## 2024-08-13 DIAGNOSIS — B351 Tinea unguium: Secondary | ICD-10-CM

## 2024-08-13 NOTE — Progress Notes (Signed)
"  °  Subjective:  Patient ID: Jerry Rangel, male    DOB: [birth year],  MRN: [id number]  Chief Complaint  Patient presents with   [facility]    [facility]  A1c 6.3 07/09/24.  81 mg Asprin.     74 y.o. male presents with the above complaint. History confirmed with patient. Patient presenting with pain related to dystrophic thickened elongated nails. Patient is unable to trim own nails related to nail dystrophy. Patient does have a history of T2DM.  Last A1c 6.3.  Since last visit he recently got established with vascular surgery due to noncompressible vessels and absent toe pressures on ABI.  They are monitoring for now as he is otherwise asymptomatic.  Reports that he has been continuing the ketoconazole  cream right dorsal midfoot rash which has been improving  Objective:  Physical Exam: warm to cool skin temperature gradient, capillary refill time 3 to 5 seconds to digits, atrophic pedal skin, pedal hair growth absent nail exam onychomycosis of the toenails, onycholysis, and dystrophic nails greater 3 mm DP pulses faintly palpable, PT pulses faintly palpable, and protective sensation intact Left Foot:  Pain with palpation of nails due to elongation and dystrophic growth.  Right Foot: Pain with palpation of nails due to elongation and dystrophic growth.  Dorsal right foot rash appearance improved, mildy darkened discoloration  Assessment:   1. Type 2 diabetes mellitus with diabetic peripheral angiopathy without gangrene, unspecified whether long term insulin use (HCC)   2. Pain due to onychomycosis of toenails of both feet   3. Tinea pedis of right foot      Plan:  Patient was evaluated and treated and all questions answered.  # Tinea pedis right foot - Rash dorsal right foot, favor tinea pedis at this point - Has had good improvement - Will hold ketoconazole  cream for now and apply regular moisturizing cream and lotion to see how it responds - If it worsens, he can reinitiate ketoconazole  cream and  follow-up as needed consider oral medication or alternative topical at that point in time  #Onychomycosis with pain  -Nails palliatively debrided as below. -Educated on self-care - At risk footcare due to diabetes with peripheral angiopathy and decreased pedal pulses  Procedure: Nail Debridement Rationale: Pain Type of Debridement: manual, sharp debridement. Instrumentation: Nail nipper, rotary burr. Number of Nails: 10  Patient educated on diabetes. Discussed proper diabetic foot care and discussed risks and complications of disease. Educated patient in depth on reasons to return to the office immediately should he/she discover anything concerning or new on the feet. All questions answered. Discussed proper shoes as well.    Return in about 3 months (around 11/11/2024) for Diabetic Foot Care.         Ethan Saddler, DPM [facility]                    "

## 2024-11-16 ENCOUNTER — Ambulatory Visit: Admitting: Podiatry

## 2024-12-08 ENCOUNTER — Encounter

## 2025-01-07 ENCOUNTER — Ambulatory Visit: Admitting: Family Medicine
